# Patient Record
Sex: Male | Born: 1950
Health system: Southern US, Community
[De-identification: ages and names within clinical notes are randomized; demographics above are authoritative.]

## PROBLEM LIST (undated history)

## (undated) DIAGNOSIS — H9190 Unspecified hearing loss, unspecified ear: Secondary | ICD-10-CM

---

## 2009-07-02 ENCOUNTER — Emergency Department (HOSPITAL_COMMUNITY): Admission: EM | Admit: 2009-07-02 | Discharge: 2009-07-02 | Payer: Self-pay | Admitting: Family Medicine

## 2012-01-09 ENCOUNTER — Emergency Department (HOSPITAL_COMMUNITY)
Admission: EM | Admit: 2012-01-09 | Discharge: 2012-01-09 | Disposition: A | Discharge status: Home / Self Care | Payer: BC Managed Care – PPO | Source: Home / Self Care | Attending: Emergency Medicine | Admitting: Emergency Medicine

## 2012-01-09 ENCOUNTER — Emergency Department (INDEPENDENT_AMBULATORY_CARE_PROVIDER_SITE_OTHER): Payer: BC Managed Care – PPO

## 2012-01-09 ENCOUNTER — Encounter (HOSPITAL_COMMUNITY): Payer: Self-pay | Admitting: *Deleted

## 2012-01-09 DIAGNOSIS — M5412 Radiculopathy, cervical region: Secondary | ICD-10-CM

## 2012-01-09 HISTORY — DX: Unspecified hearing loss, unspecified ear: H91.90

## 2012-01-09 MED ORDER — MELOXICAM 7.5 MG PO TABS: 7.5000 mg | ORAL_TABLET | Freq: Every day | ORAL | Status: AC

## 2012-01-09 NOTE — Discharge Instructions (Signed)
Today we discussed your x-ray results and diagnostic impression based on your symptoms and physical exam. Have recommended you followup with your new primary care Dr. for this reason and also for your health care maintenance and screening recommendations for your age group. (See referral swallow).  While you establish care with a primary care doctor's office have described to you what symptoms will require further emergent attention if symptoms were to change or exacerbate. Take this prescribe medicine for 2 weeks as discussed.    Cervical Radiculopathy Cervical radiculopathy happens when a nerve in the neck is pinched or bruised by a slipped (herniated) disk or by arthritic changes in the bones of the cervical spine. This can occur due to an injury or as part of the normal aging process. Pressure on the cervical nerves can cause pain or numbness that runs from your neck all the way down into your arm and fingers. CAUSES  There are many possible causes, including:  Injury.   Muscle tightness in the neck from overuse.   Swollen, painful joints (arthritis).   Breakdown or degeneration in the bones and joints of the spine (spondylosis) due to aging.   Bone spurs that may develop near the cervical nerves.  SYMPTOMS  Symptoms include pain, weakness, or numbness in the affected arm and hand. Pain can be severe or irritating. Symptoms may be worse when extending or turning the neck. DIAGNOSIS  Your caregiver will ask about your symptoms and do a physical exam. He or she may test your strength and reflexes. X-rays, CT scans, and MRI scans may be needed in cases of injury or if the symptoms do not go away after a period of time. Electromyography (EMG) or nerve conduction testing may be done to study how your nerves and muscles are working. TREATMENT  Your caregiver may recommend certain exercises to help relieve your symptoms. Cervical radiculopathy can, and often does, get better with time and  treatment. If your problems continue, treatment options may include:  Wearing a soft collar for short periods of time.   Physical therapy to strengthen the neck muscles.   Medicines, such as nonsteroidal anti-inflammatory drugs (NSAIDs), oral corticosteroids, or spinal injections.   Surgery. Different types of surgery may be done depending on the cause of your problems.  HOME CARE INSTRUCTIONS   Put ice on the affected area.   Put ice in a plastic bag.   Place a towel between your skin and the bag.   Leave the ice on for 15 to 20 minutes, 3 to 4 times a day or as directed by your caregiver.   Use a flat pillow when you sleep.   Only take over-the-counter or prescription medicines for pain, discomfort, or fever as directed by your caregiver.   If physical therapy was prescribed, follow your caregiver's directions.   If a soft collar was prescribed, use it as directed.  SEEK IMMEDIATE MEDICAL CARE IF:   Your pain gets much worse and cannot be controlled with medicines.   You have weakness or numbness in your hand, arm, face, or leg.   You have a high fever or a stiff, rigid neck.   You lose bowel or bladder control (incontinence).   You have trouble with walking, balance, or speaking.  MAKE SURE YOU:   Understand these instructions.   Will watch your condition.   Will get help right away if you are not doing well or get worse.  Document Released: 06/29/2001 Document Revised: 09/23/2011 Document Reviewed:  05/18/2011 ExitCare Patient Information 2012 [place], [place].

## 2012-01-09 NOTE — ED Provider Notes (Signed)
History     CSN: [id number]  Arrival date & time 01/09/12  0905   First MD Initiated Contact with Patient 01/09/12 [phone]      Chief Complaint  Patient presents with  . Shoulder Pain    (Consider location/radiation/quality/duration/timing/severity/associated sxs/prior treatment) HPI Comments: P patient describes to translator (accompanying patient) has had this gradual onset of left shoulder pain that shoots all way up to his neck and down to his upper arm forearm down to his hands feeling occasional numbness and tingling on his left index finger. Patient has tried over-the-counter Advil and different pads. Raising his arm up seems to exacerbate the pain. Patient denies any recent injury trauma or significant change in physical activity.  Significant history patient denies any chest pain, shortness of breath, previous shoulder pains, no respiratory symptoms including cough.   Patient is a 61 y.o. male presenting with shoulder pain. The history is provided by the patient and a relative. The history is limited by a language barrier. No language interpreter was used (translasting adult- 100 % bilungual).  Shoulder Pain This is a new problem. The problem occurs constantly. The problem has been gradually worsening. Pertinent negatives include no chest pain, no abdominal pain, no headaches and no shortness of breath. Exacerbated by: shoulder and neck movements. He has tried nothing for the symptoms. The treatment provided no relief.    Past Medical History  Diagnosis Date  . Hard of hearing     History reviewed. No pertinent past surgical history.  History reviewed. No pertinent family history.  History  Substance Use Topics  . Smoking status: Current Everyday Smoker -- 0.3 packs/day  . Smokeless tobacco: Not on file  . Alcohol Use: Yes     Occasional      Review of Systems  Constitutional: Negative for chills, activity change, appetite change, fatigue and unexpected weight  change.  HENT: Positive for neck pain.   Respiratory: Negative for cough and shortness of breath.   Cardiovascular: Negative for chest pain, palpitations and leg swelling.  Gastrointestinal: Negative for abdominal pain.  Musculoskeletal: Positive for arthralgias.  Skin: Negative for rash.  Neurological: Positive for numbness. Negative for dizziness, speech difficulty, weakness and headaches.    Allergies  Review of patient's allergies indicates no known allergies.  Home Medications   Current Outpatient Rx  Name Route Sig Dispense Refill  . MELOXICAM 7.5 MG PO TABS Oral Take 1 tablet (7.5 mg total) by mouth daily. 14 tablet 0    BP 161/80  Pulse 94  Temp(Src) 97.9 F (36.6 C) (Oral)  Resp 14  SpO2 98%  Physical Exam  Nursing note and vitals reviewed. Constitutional: He appears well-developed and well-nourished.  HENT:  Head: Normocephalic.  Eyes: Conjunctivae are normal.  Neck: Neck supple. No JVD present. Spinous process tenderness and muscular tenderness present. Carotid bruit is not present. No rigidity. No erythema and normal range of motion present. No Kernig's sign noted.  Cardiovascular: Regular rhythm and normal pulses.   No extrasystoles are present.  Pulmonary/Chest: Effort normal and breath sounds normal. He exhibits no deformity and no swelling.  Musculoskeletal:       Cervical back: He exhibits tenderness, bony tenderness and pain. He exhibits no swelling, no edema, no deformity and no spasm.       Back:  Lymphadenopathy:    He has no cervical adenopathy.  Skin: No rash noted. No erythema.    ED Course  Procedures (including critical care time)  Labs Reviewed - No  data to display Dg Cervical Spine Complete  01/09/2012  *RADIOLOGY REPORT*  Clinical Data: Gradual onset of left shoulder pain  CERVICAL SPINE - COMPLETE 4+ VIEW  Comparison: None.  Findings:  C1 to the superior endplate of T1 is visualized on the lateral radiograph.  Normal alignment of the  cervical spine without anterolisthesis or retrolisthesis. The dens is normally seated between the lateral masses of C1.  Vertebral body heights are preserved.  Limbus bodies are incidentally noted about the anterior inferior endplates of the C3, C5 and C6 vertebral bodies.  Query mild DDD at C5 - C6 with mild disc space height loss and endplate irregularity.  The bilateral neural foramina are grossly patent.  Limited visualization of the lung apices is normal.  Likely vascular calcifications within the bilateral carotid bulbs, right greater than left.  IMPRESSION: Mild DDD of C5 - C6.  Original Report Authenticated By: Waynard Reeds, M.D.     1. Cervical radiculopathy       MDM  New onset of left posterior neck and shoulder pain with peripheral paresthesias. No muscular deficits noted cervical C-spine showed mild degenerative changes C5-C6. Patient will followup with primary care Dr. and instructed about what further symptoms to be watchful for and that will require followup if this paresthesias and left and neck pain persist. Noted also to be hypertensive told to followup for blood pressure recheck        Jimmie Molly, MD 01/09/12 1147

## 2012-01-09 NOTE — ED Notes (Signed)
C/O gradual onset left shoulder pain since 3/10.  Denies injury.  C/O intermittent numbness radiating down to fingers - especially 2nd digit.  Has  Tried Icy-Hot, Advil, and salon pads? without relief.  C/O painful ROM.  Denies chest pain.

## 2012-07-05 ENCOUNTER — Emergency Department (HOSPITAL_COMMUNITY)
Admission: EM | Admit: 2012-07-05 | Discharge: 2012-07-05 | Disposition: A | Discharge status: Home / Self Care | Payer: BC Managed Care – PPO | Source: Home / Self Care

## 2012-07-05 ENCOUNTER — Encounter (HOSPITAL_COMMUNITY): Payer: Self-pay

## 2012-07-05 DIAGNOSIS — M549 Dorsalgia, unspecified: Secondary | ICD-10-CM

## 2012-07-05 DIAGNOSIS — R109 Unspecified abdominal pain: Secondary | ICD-10-CM

## 2012-07-05 LAB — POCT URINALYSIS DIP (DEVICE)
Protein, ur: >=300 mg/dL — AB
Specific Gravity, Urine: >=1.03 (ref 1.005–1.030)
Urobilinogen, UA: 1 mg/dL (ref 0.0–1.0)
pH: 5.5 (ref 5.0–8.0)

## 2012-07-05 MED ORDER — CYCLOBENZAPRINE HCL 5 MG PO TABS: 5.0000 mg | ORAL_TABLET | Freq: Three times a day (TID) | ORAL | Status: DC | PRN

## 2012-07-05 NOTE — ED Notes (Signed)
C/o abdominal pain, states that it radiates right side to lower back, also states very little urine when he goes to the restroom

## 2012-07-05 NOTE — ED Provider Notes (Signed)
History     CSN: [id number]  Arrival date & time 07/05/12  1633   None     Chief Complaint  Patient presents with  . 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 Pain across lower back and bilateral hips. Worse with bending forward, twisting or other movements of the torso.  No known trauma or injurious event. Denies numbness, tingling or focal weakness.  He is wearing several Solonpas patches,  Patient is a 61 y.o. male presenting with abdominal pain.  Abdominal Pain The primary symptoms of the illness include abdominal pain.    Past Medical History  Diagnosis Date  . Hard of hearing     History reviewed. No pertinent past surgical history.  History reviewed. No pertinent family history.  History  Substance Use Topics  . Smoking status: Current Every Day Smoker -- 0.3 packs/day  . Smokeless tobacco: Not on file  . Alcohol Use: Yes     Occasional      Review of Systems  Constitutional: Negative.   Respiratory: Negative.   Gastrointestinal: Positive for abdominal pain.  Genitourinary: Negative.   Musculoskeletal:       As per HPI  Skin: Negative.   Neurological: Negative for dizziness, weakness, numbness and headaches.    Allergies  Review of patient's allergies indicates no known allergies.  Home Medications   Current Outpatient Rx  Name Route Sig Dispense Refill  . CYCLOBENZAPRINE HCL 5 MG PO TABS Oral Take 1 tablet (5 mg total) by mouth 3 (three) times daily as needed for muscle spasms. 30 tablet 0  . MELOXICAM 7.5 MG PO TABS Oral Take 1 tablet (7.5 mg total) by mouth daily. 14 tablet 0    BP 151/64  Pulse 93  Temp 98.2 F (36.8 C) (Oral)  Resp 18  SpO2 100%  Physical Exam  Constitutional: He is oriented to person, place, and time. He appears well-developed and well-nourished.   HENT:  Head: Normocephalic and atraumatic.  Eyes: EOM are normal. Left eye exhibits no discharge.  Neck: Normal range of motion. Neck supple.  Abdominal: Soft. He exhibits no distension. There is no tenderness. There is no rebound.  Musculoskeletal: He exhibits tenderness. He exhibits no edema.       Tenderness along the lower para lumbosacral musculature, superior borders of the iliac creasts bilat.  Unable to demonstrate flex/extenor bilateral movements (side to side) without pain at approx 20 degrees.  Neurological: He is alert and oriented to person, place, and time. No cranial nerve deficit.  Skin: Skin is warm and dry.  Psychiatric: He has a normal mood and affect.    ED Course  Procedures (including critical care time)  Labs Reviewed  POCT URINALYSIS DIP (DEVICE) - Abnormal; Notable for the following:    Bilirubin Urine SMALL (*)     Ketones, ur 15 (*)     Protein, ur >=300 (*)     All other components within normal limits   No results found.   1. Back pain   2. Abdominal wall pain       MDM  Back pain instructions Heat to back and hips Motrin 400 q 6-8h prn and Flexeril tid prn No work for 3 d         Hayden Rasmussen, NP 07/05/12 1844

## 2012-07-05 NOTE — ED Provider Notes (Signed)
Medical screening examination/treatment/procedure(s) were performed by non-physician practitioner and as supervising physician I was immediately available for consultation/collaboration.  Raynald Blend, MD 07/05/12 2053

## 2013-04-04 ENCOUNTER — Emergency Department (INDEPENDENT_AMBULATORY_CARE_PROVIDER_SITE_OTHER): Payer: BC Managed Care – PPO

## 2013-04-04 ENCOUNTER — Encounter (HOSPITAL_COMMUNITY): Payer: Self-pay

## 2013-04-04 ENCOUNTER — Emergency Department (INDEPENDENT_AMBULATORY_CARE_PROVIDER_SITE_OTHER)
Admission: EM | Admit: 2013-04-04 | Discharge: 2013-04-04 | Disposition: A | Discharge status: Home / Self Care | Payer: BC Managed Care – PPO | Source: Home / Self Care | Attending: Family Medicine | Admitting: Family Medicine

## 2013-04-04 DIAGNOSIS — S60222A Contusion of left hand, initial encounter: Secondary | ICD-10-CM

## 2013-04-04 DIAGNOSIS — S60229A Contusion of unspecified hand, initial encounter: Secondary | ICD-10-CM

## 2013-04-04 NOTE — ED Notes (Signed)
Reportedly hand injury to left hand earlier today at work; here w daughter who is acting as Nurse, learning disability ; NAD at present. C/o pain thumb, thenar region, wrist

## 2013-04-04 NOTE — ED Provider Notes (Signed)
History     CSN: [id number]  Arrival date & time 04/04/13  1807   First MD Initiated Contact with Patient 04/04/13 1826      Chief Complaint  Patient presents with  . Hand Injury    (Consider location/radiation/quality/duration/timing/severity/associated sxs/prior treatment) Patient is a 62 y.o. male presenting with hand injury. The history is provided by the patient. The history is limited by a language barrier. Language interpreter used: family member translated.  Hand Injury Location:  Hand Time since incident:  8 hours Injury: yes   Mechanism of injury comment:  Metal dropped on left thumb. Hand location:  L hand Pain details:    Severity:  Mild   Progression:  Unchanged Chronicity:  New Dislocation: no   Foreign body present:  No foreign bodies   Past Medical History  Diagnosis Date  . Hard of hearing     History reviewed. No pertinent past surgical history.  History reviewed. No pertinent family history.  History  Substance Use Topics  . Smoking status: Current Every Day Smoker -- 0.33 packs/day  . Smokeless tobacco: Not on file  . Alcohol Use: Yes     Comment: Occasional      Review of Systems  Constitutional: Negative.   Musculoskeletal: Positive for joint swelling.  Skin: Negative for wound.    Allergies  Review of patient's allergies indicates no known allergies.  Home Medications   Current Outpatient Rx  Name  Route  Sig  Dispense  Refill  . cyclobenzaprine (FLEXERIL) 5 MG tablet   Oral   Take 1 tablet (5 mg total) by mouth 3 (three) times daily as needed for muscle spasms.   30 tablet   0     BP 162/68  Pulse 83  Temp(Src) 97.6 F (36.4 C) (Oral)  Resp 16  SpO2 100%  Physical Exam  Nursing note and vitals reviewed. Constitutional: He is oriented to person, place, and time. He appears well-developed and well-nourished.  Musculoskeletal: He exhibits tenderness.       Hands: Neurological: He is alert and oriented to person,  place, and time.  Skin: Skin is warm and dry.    ED Course  Procedures (including critical care time)  Labs Reviewed - No data to display Dg Hand Complete Left  04/04/2013   *RADIOLOGY REPORT*  Clinical Data: Hand injury.  LEFT HAND - COMPLETE 3+ VIEW  Comparison: None  Findings: No acute bony abnormality.  Specifically, no fracture, subluxation, or dislocation.  Soft tissues are intact.  IMPRESSION: No acute bony abnormality.   Original Report Authenticated By: Charlett Nose, M.D.     1. Contusion, hand, left, initial encounter       MDM  X-rays reviewed and report per radiologist.         Linna Hoff, MD 04/04/13 [phone]

## 2013-04-04 NOTE — ED Notes (Signed)
UDS performed by T Berton Lan

## 2013-08-28 ENCOUNTER — Emergency Department (HOSPITAL_COMMUNITY)
Admission: EM | Admit: 2013-08-28 | Discharge: 2013-08-28 | Disposition: A | Discharge status: Home / Self Care | Payer: BC Managed Care – PPO | Source: Home / Self Care

## 2013-08-28 ENCOUNTER — Encounter (HOSPITAL_COMMUNITY): Payer: Self-pay | Admitting: Emergency Medicine

## 2013-08-28 DIAGNOSIS — S139XXA Sprain of joints and ligaments of unspecified parts of neck, initial encounter: Secondary | ICD-10-CM

## 2013-08-28 DIAGNOSIS — H8110 Benign paroxysmal vertigo, unspecified ear: Secondary | ICD-10-CM

## 2013-08-28 DIAGNOSIS — S161XXA Strain of muscle, fascia and tendon at neck level, initial encounter: Secondary | ICD-10-CM

## 2013-08-28 MED ORDER — MECLIZINE HCL 25 MG PO TABS: 25.0000 mg | ORAL_TABLET | Freq: Four times a day (QID) | ORAL | Status: DC

## 2013-08-28 MED ORDER — DICLOFENAC POTASSIUM 50 MG PO TABS: 50.0000 mg | ORAL_TABLET | Freq: Three times a day (TID) | ORAL | Status: DC

## 2013-08-28 NOTE — ED Provider Notes (Signed)
CSN: [id number]     Arrival date & time 08/28/13  1640 History   First MD Initiated Contact with Patient 08/28/13 1825     Chief Complaint  Patient presents with  . Neck Pain  . Dizziness   (Consider location/radiation/quality/duration/timing/severity/associated sxs/prior Treatment) HPI Comments: 62 year old [place] but is not speaking relation is accompanied by his niece who does. She is a Nurse, learning disability. 48 hours ago the patient awoke with a sore stiff neck. There is pain in the back left muscles that radiates up to the occiput and vertex. He is also complaining of dizziness and occasional burning or vision which is worse upon movement, turning of the head placing himself in a supine position and rising from that position.   Past Medical History  Diagnosis Date  . Hard of hearing    History reviewed. No pertinent past surgical history. No family history on file. History  Substance Use Topics  . Smoking status: Current Every Day Smoker -- 0.33 packs/day  . Smokeless tobacco: Not on file  . Alcohol Use: Yes     Comment: Occasional    Review of Systems  Constitutional: Negative for fever, diaphoresis, activity change, appetite change and fatigue.  Eyes: Negative for photophobia, pain, discharge, redness and itching.  Respiratory: Negative.   Cardiovascular: Negative.   Gastrointestinal: Negative.   Genitourinary: Negative.   Musculoskeletal: Positive for neck pain.  Skin: Negative.   Neurological: Positive for dizziness and headaches. Negative for tremors, syncope, speech difficulty and numbness.    Allergies  Review of patient's allergies indicates no known allergies.  Home Medications   Current Outpatient Rx  Name  Route  Sig  Dispense  Refill  . diclofenac (CATAFLAM) 50 MG tablet   Oral   Take 1 tablet (50 mg total) by mouth 3 (three) times daily. Prn neck pain.  Take with food.   21 tablet   0   . meclizine (ANTIVERT) 25 MG tablet   Oral   Take 1 tablet (25 mg  total) by mouth 4 (four) times daily. As needed for dizziness. May cause drowsiness.   28 tablet   0    BP 151/94  Pulse 79  Temp(Src) 98.4 F (36.9 C) (Oral)  Resp 16  SpO2 98% Physical Exam  Nursing note and vitals reviewed. Constitutional: He is oriented to person, place, and time. He appears well-developed and well-nourished. No distress.  HENT:  Mouth/Throat: Oropharynx is clear and moist. No oropharyngeal exudate.  Left TM with old ruptured membrane. No erythema or drainage. Right TM somewhat distorted and faint erythema at the 2:00 position.  Eyes: Conjunctivae and EOM are normal. Pupils are equal, round, and reactive to light.  Neck: Normal range of motion. Neck supple.  Tenderness over the left splenius capitis muscle tracts over the occiput. There is muscle pain is made worse with rotation of the head which is beyond 45 to the left and right. Flexion of the neck to 45. No spinal tenderness. No deformity or swelling. No carotid bruits.  Cardiovascular: Normal rate, regular rhythm and normal heart sounds.   Pulmonary/Chest: Effort normal and breath sounds normal. No respiratory distress. He has no wheezes.  Musculoskeletal: Normal range of motion. He exhibits no edema.  Lymphadenopathy:    He has no cervical adenopathy.  Neurological: He is alert and oriented to person, place, and time. No cranial nerve deficit or sensory deficit. He exhibits normal muscle tone. Gait normal.  Skin: Skin is warm and dry. No rash noted.  Psychiatric:  He has a normal mood and affect.    ED Course  Procedures (including critical care time) Labs Review Labs Reviewed - No data to display Imaging Review No results found.      MDM   1. BPV (benign positional vertigo)   2. Strain of neck muscle, initial encounter      Follow with a doctor., call the above number to establish with a PCP. Very important. If worse, new symptoms or problems seek medical care promptly.  antivert 25 q 8h  prn. Plenty of fluids.  Cataflam for pain  Hayden Rasmussen, NP 08/28/13 [phone]

## 2013-08-28 NOTE — ED Notes (Signed)
Neck soreness, dizziness, onset Sunday.

## 2013-09-19 NOTE — ED Provider Notes (Signed)
Medical screening examination/treatment/procedure(s) were performed by resident physician or non-physician practitioner and as supervising physician I was immediately available for consultation/collaboration.   Tommy Bentley DOUGLAS MD.   Tommy Bentley D Tommy Borman, MD 09/19/13 1900 

## 2015-09-17 ENCOUNTER — Emergency Department (INDEPENDENT_AMBULATORY_CARE_PROVIDER_SITE_OTHER): Payer: BLUE CROSS/BLUE SHIELD

## 2015-09-17 ENCOUNTER — Emergency Department (HOSPITAL_COMMUNITY)
Admission: EM | Admit: 2015-09-17 | Discharge: 2015-09-17 | Disposition: A | Discharge status: Home / Self Care | Payer: BLUE CROSS/BLUE SHIELD | Source: Home / Self Care | Attending: Emergency Medicine | Admitting: Emergency Medicine

## 2015-09-17 ENCOUNTER — Encounter (HOSPITAL_COMMUNITY): Payer: Self-pay | Admitting: Emergency Medicine

## 2015-09-17 DIAGNOSIS — M4692 Unspecified inflammatory spondylopathy, cervical region: Secondary | ICD-10-CM

## 2015-09-17 DIAGNOSIS — M542 Cervicalgia: Secondary | ICD-10-CM

## 2015-09-17 DIAGNOSIS — M47812 Spondylosis without myelopathy or radiculopathy, cervical region: Secondary | ICD-10-CM

## 2015-09-17 MED ORDER — DICLOFENAC SODIUM 50 MG PO TBEC: 50.0000 mg | DELAYED_RELEASE_TABLET | Freq: Three times a day (TID) | ORAL | Status: AC | PRN

## 2015-09-17 MED ORDER — PREDNISONE 50 MG PO TABS: ORAL_TABLET | ORAL | Status: AC

## 2015-09-17 MED ORDER — GABAPENTIN 300 MG PO CAPS: 300.0000 mg | ORAL_CAPSULE | Freq: Every day | ORAL | Status: AC

## 2015-09-17 NOTE — ED Notes (Signed)
The patient presented to the [facility] with a complaint of neck pain that is accompanied by dizziness that started yesterday. The patient denied any know injury.

## 2015-09-17 NOTE — ED Provider Notes (Signed)
CSN: [id number]     Arrival date & time 09/17/15  1701 History   First MD Initiated Contact with Patient 09/17/15 1743     Chief Complaint  Patient presents with  . Neck Pain  . Dizziness   (Consider location/radiation/quality/duration/timing/severity/associated sxs/prior Treatment) HPI He is a 64 year old man here for evaluation of right neck and shoulder pain. His granddaughter is present who acts as interpreter. He states that yesterday he developed pain in the right neck and shoulder. It radiates to the back of his head and forehead. He states he is also having episodes of dizziness that he describes as a spinning sensation. He denies any weakness, numbness, tingling in his upper extremities. The pain is worse with certain movements.  No fevers. No known injury or trauma. He works in the [employer], but states he does not have to lift heavy things.  Past Medical History  Diagnosis Date  . Hard of hearing    History reviewed. No pertinent past surgical history. History reviewed. No pertinent family history. Social History  Substance Use Topics  . Smoking status: Current Every Day Smoker -- 0.33 packs/day  . Smokeless tobacco: None  . Alcohol Use: Yes     Comment: Occasional    Review of Systems As in history of present illness Allergies  Review of patient's allergies indicates no known allergies.  Home Medications   Prior to Admission medications   Medication Sig Start Date End Date Taking? Authorizing Provider  diclofenac (VOLTAREN) 50 MG EC tablet Take 1 tablet (50 mg total) by mouth 3 (three) times daily as needed for moderate pain. 09/17/15   Melony Overly, MD  gabapentin (NEURONTIN) 300 MG capsule Take 1 capsule (300 mg total) by mouth at bedtime. 09/17/15   Melony Overly, MD  predniSONE (DELTASONE) 50 MG tablet Take 1 pill daily for 5 days. 09/17/15   Melony Overly, MD   Meds Ordered and Administered this Visit  Medications - No data to display  BP 150/75 mmHg   Pulse 76  Temp(Src) 98.1 F (36.7 C) (Oral)  Resp 16  SpO2 100% No data found.   Physical Exam  Constitutional: He is oriented to person, place, and time. He appears well-developed and well-nourished. No distress.  HENT:  Right TM is distorted, but no erythema. 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  Neck:  He has good range of motion, but pain with flexion and rotation. He does have some tenderness along the midline neck. He has muscle spasm in the right trapezius. He is tender along the trapezius.  Cardiovascular: Normal rate, regular rhythm and normal heart sounds.   Pulmonary/Chest: Effort normal and breath sounds normal. No respiratory distress. He has no wheezes. He has no rales.  Musculoskeletal:  Right shoulder: No swelling. He is tender along the superior shoulder. 5 out of 5 strength in bilateral upper extremities. Positive Hawkins.  Neurological: He is alert and oriented to person, place, and time.    ED Course  Procedures (including critical care time)  Labs Review Labs Reviewed - No data to display  Imaging Review Dg Cervical Spine Complete  09/17/2015  CLINICAL DATA:  Neck pain started yesterday, no injury. EXAM: CERVICAL SPINE - COMPLETE 4+ VIEW COMPARISON:  January 09, 2012 FINDINGS: There is no evidence of cervical spine fracture or prevertebral soft tissue swelling. Alignment is normal. There is narrowed joint space and osteophyte formation at C4, C5 and C6. There is narrowed right C3-4, C4-5, C5-6 neural foramina and  narrowed left C5 6 neural foramina due to osteophyte encroachment. IMPRESSION: No acute fracture or dislocation. Degenerative joint changes of cervical spine. Electronically Signed   By: Abelardo Diesel M.D.   On: 09/17/2015 18:42      MDM   1. Neck pain   2. Arthritis of neck ([place])    X-ray raises some concern for nerve impingement. We'll treat with prednisone and gabapentin. Diclofenac for pain. Follow-up if not improving in 1-2 weeks.    Melony Overly, MD 09/17/15 [phone]

## 2015-09-17 NOTE — Discharge Instructions (Signed)
You have arthritis in your neck that is contributing to your pain. Take prednisone daily for 5 days. Take diclofenac 3 times a day as needed for pain. Apply a heating pad to the right neck and shoulder 2-3 times a day. Take gabapentin at night for 2 weeks. Follow-up as needed.

## 2016-04-21 ENCOUNTER — Ambulatory Visit (HOSPITAL_COMMUNITY)
Admission: EM | Admit: 2016-04-21 | Discharge: 2016-04-21 | Disposition: A | Discharge status: Home / Self Care | Payer: Medicare Other | Attending: Emergency Medicine | Admitting: Emergency Medicine

## 2016-04-21 ENCOUNTER — Encounter (HOSPITAL_COMMUNITY): Payer: Self-pay | Admitting: Emergency Medicine

## 2016-04-21 ENCOUNTER — Ambulatory Visit (INDEPENDENT_AMBULATORY_CARE_PROVIDER_SITE_OTHER): Payer: Medicare Other

## 2016-04-21 DIAGNOSIS — M25511 Pain in right shoulder: Secondary | ICD-10-CM | POA: Diagnosis not present

## 2016-04-21 MED ORDER — TRIAMCINOLONE ACETONIDE 40 MG/ML IJ SUSP
40.0000 mg | Freq: Once | INTRAMUSCULAR | Status: AC
  Administered 2016-04-21: 40 mg via INTRA_ARTICULAR

## 2016-04-21 MED ORDER — BUPIVACAINE HCL (PF) 0.25 % IJ SOLN
10.0000 mL | Freq: Once | INTRAMUSCULAR | Status: AC
  Administered 2016-04-21: 10 mL

## 2016-04-21 MED ORDER — DICLOFENAC SODIUM & CAPSAICIN 1.5 & 0.025 % CO THPK: 1.0000 "application " | PACK | Freq: Two times a day (BID) | Status: AC

## 2016-04-21 MED ORDER — TRIAMCINOLONE ACETONIDE 40 MG/ML IJ SUSP
INTRAMUSCULAR | Status: AC
  Filled 2016-04-21: qty 1

## 2016-04-21 MED ORDER — LIDOCAINE HCL 2 % IJ SOLN
INTRAMUSCULAR | Status: AC
  Filled 2016-04-21: qty 20

## 2016-04-21 MED ORDER — BUPIVACAINE HCL (PF) 0.5 % IJ SOLN
INTRAMUSCULAR | Status: AC
  Filled 2016-04-21: qty 10

## 2016-04-21 MED ORDER — LIDOCAINE HCL (PF) 1 % IJ SOLN
5.0000 mL | Freq: Once | INTRAMUSCULAR | Status: AC
  Administered 2016-04-21: 5 mL

## 2016-04-21 NOTE — Discharge Instructions (Signed)
Heat Therapy Heat therapy can help ease sore, stiff, injured, and tight muscles and joints. Heat relaxes your muscles, which may help ease your pain. Heat therapy should only be used on old, pre-existing, or long-lasting (chronic) injuries. Do not use heat therapy unless told by your doctor. HOW TO USE HEAT THERAPY There are several different kinds of heat therapy, including:  Moist heat pack.  Warm water bath.  Hot water bottle.  Electric heating pad.  Heated gel pack.  Heated wrap.  Electric heating pad. GENERAL HEAT THERAPY RECOMMENDATIONS   Do not sleep while using heat therapy. Only use heat therapy while you are awake.  Your skin may turn pink while using heat therapy. Do not use heat therapy if your skin turns red.  Do not use heat therapy if you have new pain.  High heat or long exposure to heat can cause burns. Be careful when using heat therapy to avoid burning your skin.  Do not use heat therapy on areas of your skin that are already irritated, such as with a rash or sunburn. GET HELP IF:   You have blisters, redness, swelling (puffiness), or numbness.  You have new pain.  Your pain is worse. MAKE SURE YOU:  Understand these instructions.  Will watch your condition.  Will get help right away if you are not doing well or get worse.   This information is not intended to replace advice given to you by your health care provider. Make sure you discuss any questions you have with your health care provider.   Document Released: 12/27/2011 Document Revised: 10/25/2014 Document Reviewed: 11/27/2013 Elsevier Interactive Patient Education 2016 [employer] Pain Joint pain, which is also called arthralgia, can be caused by many things. Joint pain often goes away when you follow your health care provider's instructions for relieving pain at home. However, joint pain can also be caused by conditions that require further treatment. Common causes of joint pain  include:  Bruising in the area of the joint.  Overuse of the joint.  Wear and tear on the joints that occur with aging (osteoarthritis).  Various other forms of arthritis.  A buildup of a crystal form of uric acid in the joint (gout).  Infections of the joint (septic arthritis) or of the bone (osteomyelitis). Your health care provider may recommend medicine to help with the pain. If your joint pain continues, additional tests may be needed to diagnose your condition. HOME CARE INSTRUCTIONS Watch your condition for any changes. Follow these instructions as directed to lessen the pain that you are feeling.  Take medicines only as directed by your health care provider.  Rest the affected area for as long as your health care provider says that you should. If directed to do so, raise the painful joint above the level of your heart while you are sitting or lying down.  Do not do things that cause or worsen pain.  If directed, apply ice to the painful area:  Put ice in a plastic bag.  Place a towel between your skin and the bag.  Leave the ice on for 20 minutes, 2-3 times per day.  Wear an elastic bandage, splint, or sling as directed by your health care provider. Loosen the elastic bandage or splint if your fingers or toes become numb and tingle, or if they turn cold and blue.  Begin exercising or stretching the affected area as directed by your health care provider. Ask your health care provider what types  of exercise are safe for you.  Keep all follow-up visits as directed by your health care provider. This is important. SEEK MEDICAL CARE IF:  Your pain increases, and medicine does not help.  Your joint pain does not improve within 3 days.  You have increased bruising or swelling.  You have a fever.  You lose 10 lb (4.5 kg) or more without trying. SEEK IMMEDIATE MEDICAL CARE IF:  You are not able to move the joint.  Your fingers or toes become numb or they turn cold and  blue.   This information is not intended to replace advice given to you by your health care provider. Make sure you discuss any questions you have with your health care provider.   Document Released: 10/04/2005 Document Revised: 10/25/2014 Document Reviewed: 07/16/2014 Elsevier Interactive Patient Education [employer].

## 2016-04-21 NOTE — ED Provider Notes (Signed)
CSN: [id number]     Arrival date & time 04/21/16  1322 History   First MD Initiated Contact with Patient 04/21/16 1426     Chief Complaint  Patient presents with  . 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 Hard of hearing    History reviewed. No pertinent past surgical history. History reviewed. No pertinent family history. Social History  Substance Use Topics  . Smoking status: Current Every Day Smoker -- 0.33 packs/day  . Smokeless tobacco: None  . Alcohol Use: Yes     Comment: Occasional    Review of Systems  Denies: HEADACHE, NAUSEA, ABDOMINAL PAIN, CHEST PAIN, CONGESTION, DYSURIA, SHORTNESS OF BREATH  Allergies  Review of patient's allergies indicates no known allergies.  Home Medications   Prior to Admission medications   Medication Sig Start Date End Date Taking? Authorizing Provider  diclofenac (VOLTAREN) 50 MG EC tablet Take 1 tablet (50 mg total) by mouth 3 (three) times daily as needed for moderate pain. 09/17/15   Melony Overly, MD  gabapentin (NEURONTIN) 300 MG capsule Take 1 capsule (300 mg total) by mouth at bedtime. 09/17/15   Melony Overly, MD  predniSONE (DELTASONE) 50 MG tablet Take 1 pill daily for 5 days. 09/17/15   Melony Overly, MD   Meds Ordered and Administered this Visit  Medications - No data to display  BP 170/75 mmHg  Pulse 70  Temp(Src) 98.3 F (36.8 C) (Oral)  Resp 12  SpO2 100% No data found.   Physical Exam NURSES NOTES AND VITAL SIGNS REVIEWED. CONSTITUTIONAL: Well developed, well nourished, no acute distress HEENT:  normocephalic, atraumatic EYES: Conjunctiva normal NECK:normal ROM, supple, no adenopathy PULMONARY:No respiratory distress, normal effort ABDOMINAL: Soft, ND, NT BS+, No CVAT MUSCULOSKELETAL: Normal ROM of all extremities, Right anterior shoulder tender to palpation. Decreased ROM SKIN: warm and dry without rash PSYCHIATRIC: Mood and affect, behavior are normal  ED Course  Injection of joint Date/Time: 04/21/2016 3:22 PM Performed by: Konrad Felix Authorized by: Melony Overly Consent: Verbal consent obtained. Risks and benefits: risks, benefits and alternatives were discussed Consent given by: patient Patient identity confirmed: verbally with patient Time out: Immediately prior to procedure a "time out" was called to verify the correct patient, procedure, equipment, support staff and site/side marked as required. Preparation: Patient was prepped and draped in the usual sterile fashion. Local anesthesia used: no Patient sedated: no Patient tolerance: Patient tolerated the procedure well with no immediate complications   (including critical care time)  Labs Review Labs Reviewed - No data to display  Imaging Review No results found.  Reviewed films and report used in forming MDM Visual Acuity Review  Right Eye Distance:   Left Eye Distance:   Bilateral Distance:    Right Eye Near:   Left Eye Near:    Bilateral Near:       After shoulder injection patient states he is pain free and moves shoulder without worry.  MDM   1. Shoulder pain, acute, right     Patient is reassured that there are no issues that require transfer to higher level of care at  this time or additional tests. Patient is advised to continue home symptomatic treatment. Patient is advised that if there are new or worsening symptoms to attend the emergency department, contact primary care provider, or return to UC. Instructions of care provided discharged home in stable condition.    THIS NOTE WAS  GENERATED USING A VOICE RECOGNITION SOFTWARE PROGRAM. ALL REASONABLE EFFORTS  WERE MADE TO PROOFREAD THIS DOCUMENT FOR ACCURACY.  I have verbally reviewed the discharge instructions with the patient. A printed AVS was given to the patient.  All questions were answered prior to discharge.      Konrad Felix, [place] 04/21/16 [phone]

## 2016-04-21 NOTE — ED Notes (Signed)
The patient presented to the [facility] with his family with a complaint of right shoulder pain that radiates down into his right arm. 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

## 2017-12-29 DIAGNOSIS — J111 Influenza due to unidentified influenza virus with other respiratory manifestations: Secondary | ICD-10-CM | POA: Diagnosis not present

## 2018-01-09 ENCOUNTER — Ambulatory Visit: Payer: Self-pay | Admitting: Family Medicine

## 2018-01-22 ENCOUNTER — Emergency Department (HOSPITAL_COMMUNITY): Payer: Medicare Other

## 2018-01-22 ENCOUNTER — Other Ambulatory Visit: Payer: Self-pay

## 2018-01-22 ENCOUNTER — Encounter (HOSPITAL_COMMUNITY): Payer: Self-pay | Admitting: Emergency Medicine

## 2018-01-22 ENCOUNTER — Inpatient Hospital Stay (HOSPITAL_COMMUNITY)
Admission: EM | Admit: 2018-01-22 | Discharge: 2018-02-15 | DRG: 064 | Disposition: E | Payer: Medicare Other | Attending: Internal Medicine | Admitting: Internal Medicine

## 2018-01-22 DIAGNOSIS — Z515 Encounter for palliative care: Secondary | ICD-10-CM | POA: Diagnosis not present

## 2018-01-22 DIAGNOSIS — E43 Unspecified severe protein-calorie malnutrition: Secondary | ICD-10-CM

## 2018-01-22 DIAGNOSIS — I6521 Occlusion and stenosis of right carotid artery: Secondary | ICD-10-CM | POA: Diagnosis present

## 2018-01-22 DIAGNOSIS — I739 Peripheral vascular disease, unspecified: Secondary | ICD-10-CM

## 2018-01-22 DIAGNOSIS — A419 Sepsis, unspecified organism: Secondary | ICD-10-CM | POA: Diagnosis not present

## 2018-01-22 DIAGNOSIS — Z4659 Encounter for fitting and adjustment of other gastrointestinal appliance and device: Secondary | ICD-10-CM

## 2018-01-22 DIAGNOSIS — Z7189 Other specified counseling: Secondary | ICD-10-CM

## 2018-01-22 DIAGNOSIS — R0602 Shortness of breath: Secondary | ICD-10-CM

## 2018-01-22 DIAGNOSIS — N400 Enlarged prostate without lower urinary tract symptoms: Secondary | ICD-10-CM | POA: Diagnosis present

## 2018-01-22 DIAGNOSIS — R042 Hemoptysis: Secondary | ICD-10-CM | POA: Diagnosis present

## 2018-01-22 DIAGNOSIS — R270 Ataxia, unspecified: Secondary | ICD-10-CM | POA: Diagnosis present

## 2018-01-22 DIAGNOSIS — D638 Anemia in other chronic diseases classified elsewhere: Secondary | ICD-10-CM | POA: Diagnosis present

## 2018-01-22 DIAGNOSIS — E872 Acidosis: Secondary | ICD-10-CM | POA: Diagnosis present

## 2018-01-22 DIAGNOSIS — I63512 Cerebral infarction due to unspecified occlusion or stenosis of left middle cerebral artery: Principal | ICD-10-CM | POA: Diagnosis present

## 2018-01-22 DIAGNOSIS — R32 Unspecified urinary incontinence: Secondary | ICD-10-CM | POA: Diagnosis present

## 2018-01-22 DIAGNOSIS — Z66 Do not resuscitate: Secondary | ICD-10-CM | POA: Diagnosis present

## 2018-01-22 DIAGNOSIS — R4701 Aphasia: Secondary | ICD-10-CM | POA: Diagnosis present

## 2018-01-22 DIAGNOSIS — K922 Gastrointestinal hemorrhage, unspecified: Secondary | ICD-10-CM

## 2018-01-22 DIAGNOSIS — R0489 Hemorrhage from other sites in respiratory passages: Secondary | ICD-10-CM | POA: Diagnosis present

## 2018-01-22 DIAGNOSIS — I639 Cerebral infarction, unspecified: Secondary | ICD-10-CM | POA: Diagnosis not present

## 2018-01-22 DIAGNOSIS — I5030 Unspecified diastolic (congestive) heart failure: Secondary | ICD-10-CM | POA: Diagnosis present

## 2018-01-22 DIAGNOSIS — F419 Anxiety disorder, unspecified: Secondary | ICD-10-CM | POA: Diagnosis present

## 2018-01-22 DIAGNOSIS — J189 Pneumonia, unspecified organism: Secondary | ICD-10-CM | POA: Diagnosis not present

## 2018-01-22 DIAGNOSIS — I6523 Occlusion and stenosis of bilateral carotid arteries: Secondary | ICD-10-CM

## 2018-01-22 DIAGNOSIS — Z79899 Other long term (current) drug therapy: Secondary | ICD-10-CM

## 2018-01-22 DIAGNOSIS — D509 Iron deficiency anemia, unspecified: Secondary | ICD-10-CM | POA: Diagnosis present

## 2018-01-22 DIAGNOSIS — E1151 Type 2 diabetes mellitus with diabetic peripheral angiopathy without gangrene: Secondary | ICD-10-CM | POA: Diagnosis present

## 2018-01-22 DIAGNOSIS — I959 Hypotension, unspecified: Secondary | ICD-10-CM | POA: Diagnosis not present

## 2018-01-22 DIAGNOSIS — H9190 Unspecified hearing loss, unspecified ear: Secondary | ICD-10-CM | POA: Diagnosis present

## 2018-01-22 DIAGNOSIS — D72820 Lymphocytosis (symptomatic): Secondary | ICD-10-CM

## 2018-01-22 DIAGNOSIS — D899 Disorder involving the immune mechanism, unspecified: Secondary | ICD-10-CM | POA: Diagnosis present

## 2018-01-22 DIAGNOSIS — D62 Acute posthemorrhagic anemia: Secondary | ICD-10-CM | POA: Diagnosis present

## 2018-01-22 DIAGNOSIS — R29810 Facial weakness: Secondary | ICD-10-CM | POA: Diagnosis present

## 2018-01-22 DIAGNOSIS — J9601 Acute respiratory failure with hypoxia: Secondary | ICD-10-CM | POA: Diagnosis present

## 2018-01-22 DIAGNOSIS — Z8673 Personal history of transient ischemic attack (TIA), and cerebral infarction without residual deficits: Secondary | ICD-10-CM

## 2018-01-22 DIAGNOSIS — G8191 Hemiplegia, unspecified affecting right dominant side: Secondary | ICD-10-CM | POA: Diagnosis present

## 2018-01-22 DIAGNOSIS — R64 Cachexia: Secondary | ICD-10-CM | POA: Diagnosis present

## 2018-01-22 DIAGNOSIS — D61818 Other pancytopenia: Secondary | ICD-10-CM | POA: Diagnosis present

## 2018-01-22 DIAGNOSIS — R6521 Severe sepsis with septic shock: Secondary | ICD-10-CM | POA: Diagnosis not present

## 2018-01-22 DIAGNOSIS — D6489 Other specified anemias: Secondary | ICD-10-CM

## 2018-01-22 DIAGNOSIS — E11649 Type 2 diabetes mellitus with hypoglycemia without coma: Secondary | ICD-10-CM | POA: Diagnosis present

## 2018-01-22 DIAGNOSIS — L8990 Pressure ulcer of unspecified site, unspecified stage: Secondary | ICD-10-CM

## 2018-01-22 DIAGNOSIS — F17200 Nicotine dependence, unspecified, uncomplicated: Secondary | ICD-10-CM

## 2018-01-22 DIAGNOSIS — R4182 Altered mental status, unspecified: Secondary | ICD-10-CM | POA: Diagnosis not present

## 2018-01-22 DIAGNOSIS — I110 Hypertensive heart disease with heart failure: Secondary | ICD-10-CM | POA: Diagnosis present

## 2018-01-22 DIAGNOSIS — N179 Acute kidney failure, unspecified: Secondary | ICD-10-CM

## 2018-01-22 DIAGNOSIS — R000 Tachycardia, unspecified: Secondary | ICD-10-CM | POA: Diagnosis present

## 2018-01-22 DIAGNOSIS — K921 Melena: Secondary | ICD-10-CM | POA: Diagnosis present

## 2018-01-22 DIAGNOSIS — Z978 Presence of other specified devices: Secondary | ICD-10-CM

## 2018-01-22 DIAGNOSIS — R159 Full incontinence of feces: Secondary | ICD-10-CM | POA: Diagnosis present

## 2018-01-22 DIAGNOSIS — I63531 Cerebral infarction due to unspecified occlusion or stenosis of right posterior cerebral artery: Secondary | ICD-10-CM | POA: Diagnosis present

## 2018-01-22 DIAGNOSIS — Z681 Body mass index (BMI) 19 or less, adult: Secondary | ICD-10-CM

## 2018-01-22 DIAGNOSIS — Z452 Encounter for adjustment and management of vascular access device: Secondary | ICD-10-CM

## 2018-01-22 DIAGNOSIS — J9690 Respiratory failure, unspecified, unspecified whether with hypoxia or hypercapnia: Secondary | ICD-10-CM

## 2018-01-22 DIAGNOSIS — D649 Anemia, unspecified: Secondary | ICD-10-CM

## 2018-01-22 DIAGNOSIS — D696 Thrombocytopenia, unspecified: Secondary | ICD-10-CM

## 2018-01-22 DIAGNOSIS — R451 Restlessness and agitation: Secondary | ICD-10-CM | POA: Diagnosis not present

## 2018-01-22 DIAGNOSIS — E871 Hypo-osmolality and hyponatremia: Secondary | ICD-10-CM | POA: Diagnosis present

## 2018-01-22 DIAGNOSIS — F17210 Nicotine dependence, cigarettes, uncomplicated: Secondary | ICD-10-CM | POA: Diagnosis present

## 2018-01-22 DIAGNOSIS — C9200 Acute myeloblastic leukemia, not having achieved remission: Secondary | ICD-10-CM | POA: Diagnosis present

## 2018-01-22 DIAGNOSIS — G9349 Other encephalopathy: Secondary | ICD-10-CM | POA: Diagnosis present

## 2018-01-22 LAB — COMPREHENSIVE METABOLIC PANEL
ALK PHOS: 55 U/L (ref 38–126)
ALT: 10 U/L — AB (ref 17–63)
AST: 24 U/L (ref 15–41)
Albumin: 2.5 g/dL — ABNORMAL LOW (ref 3.5–5.0)
Anion gap: 11 (ref 5–15)
BUN: 30 mg/dL — AB (ref 6–20)
CALCIUM: 8.3 mg/dL — AB (ref 8.9–10.3)
CO2: 21 mmol/L — AB (ref 22–32)
CREATININE: 1.48 mg/dL — AB (ref 0.61–1.24)
Chloride: 99 mmol/L — ABNORMAL LOW (ref 101–111)
GFR calc Af Amer: 55 mL/min — ABNORMAL LOW (ref >60)
GFR calc non Af Amer: 48 mL/min — ABNORMAL LOW (ref >60)
Glucose, Bld: 139 mg/dL — ABNORMAL HIGH (ref 65–99)
Potassium: 3.9 mmol/L (ref 3.5–5.1)
Sodium: 131 mmol/L — ABNORMAL LOW (ref 135–145)
Total Bilirubin: 1.7 mg/dL — ABNORMAL HIGH (ref 0.3–1.2)
Total Protein: 8 g/dL (ref 6.5–8.1)

## 2018-01-22 LAB — DIFFERENTIAL
BASOS ABS: 0 10*3/uL (ref 0.0–0.1)
BASOS PCT: 0 %
Eosinophils Absolute: 0 10*3/uL (ref 0.0–0.7)
Eosinophils Relative: 0 %
LYMPHS PCT: 79 %
Lymphs Abs: 9.9 10*3/uL — ABNORMAL HIGH (ref 0.7–4.0)
MONO ABS: 1.3 10*3/uL — AB (ref 0.1–1.0)
MONOS PCT: 10 %
Neutro Abs: 1.4 10*3/uL — ABNORMAL LOW (ref 1.7–7.7)
Neutrophils Relative %: 11 %
nRBC: 10 /100 WBC — ABNORMAL HIGH

## 2018-01-22 LAB — PROTIME-INR
INR: 1.3
PROTHROMBIN TIME: 16.1 s — AB (ref 11.4–15.2)

## 2018-01-22 LAB — CBC
HEMATOCRIT: 12.4 % — AB (ref 39.0–52.0)
Hemoglobin: 3.5 g/dL — CL (ref 13.0–17.0)
MCH: 22.2 pg — AB (ref 26.0–34.0)
MCHC: 28.2 g/dL — ABNORMAL LOW (ref 30.0–36.0)
MCV: 78.5 fL (ref 78.0–100.0)
PLATELETS: 43 10*3/uL — AB (ref 150–400)
RBC: 1.58 MIL/uL — ABNORMAL LOW (ref 4.22–5.81)
RDW: 36 % — AB (ref 11.5–15.5)
WBC: 12.6 10*3/uL — AB (ref 4.0–10.5)

## 2018-01-22 LAB — HEMOGLOBIN AND HEMATOCRIT, BLOOD
HCT: 13.7 % — ABNORMAL LOW (ref 39.0–52.0)
Hemoglobin: 3.9 g/dL — CL (ref 13.0–17.0)

## 2018-01-22 LAB — I-STAT TROPONIN, ED: Troponin i, poc: 0 ng/mL (ref 0.00–0.08)

## 2018-01-22 LAB — APTT: aPTT: 42 seconds — ABNORMAL HIGH (ref 24–36)

## 2018-01-22 LAB — PREPARE RBC (CROSSMATCH)

## 2018-01-22 LAB — POC OCCULT BLOOD, ED: FECAL OCCULT BLD: POSITIVE — AB

## 2018-01-22 MED ORDER — THIAMINE HCL 100 MG/ML IJ SOLN
100.0000 mg | Freq: Once | INTRAMUSCULAR | Status: DC
  Filled 2018-01-22: qty 2

## 2018-01-22 MED ORDER — AZITHROMYCIN 500 MG IV SOLR
500.0000 mg | Freq: Once | INTRAVENOUS | Status: AC
Start: 2018-01-22 — End: 2018-01-23
  Administered 2018-01-23: 500 mg via INTRAVENOUS
  Filled 2018-01-22: qty 500

## 2018-01-22 MED ORDER — SODIUM CHLORIDE 0.9 % IV SOLN
80.0000 mg | Freq: Once | INTRAVENOUS | Status: AC
  Administered 2018-01-22: 80 mg via INTRAVENOUS
  Filled 2018-01-22: qty 80

## 2018-01-22 MED ORDER — IOPAMIDOL (ISOVUE-300) INJECTION 61%
INTRAVENOUS | Status: AC
  Filled 2018-01-22: qty 100

## 2018-01-22 MED ORDER — OCTREOTIDE LOAD VIA INFUSION
50.0000 ug | Freq: Once | INTRAVENOUS | Status: AC
  Administered 2018-01-22: 50 ug via INTRAVENOUS
  Filled 2018-01-22: qty 25

## 2018-01-22 MED ORDER — SODIUM CHLORIDE 0.9 % IV SOLN
50.0000 ug/h | INTRAVENOUS | Status: DC
  Administered 2018-01-22 – 2018-01-25 (×5): 50 ug/h via INTRAVENOUS
  Filled 2018-01-22 (×12): qty 1

## 2018-01-22 MED ORDER — SODIUM CHLORIDE 0.9 % IV SOLN: 10.0000 mL/h | Freq: Once | INTRAVENOUS | Status: DC

## 2018-01-22 MED ORDER — SODIUM CHLORIDE 0.9 % IV SOLN
8.0000 mg/h | INTRAVENOUS | Status: AC
  Administered 2018-01-22 – 2018-01-25 (×7): 8 mg/h via INTRAVENOUS
  Filled 2018-01-22 (×9): qty 80

## 2018-01-22 MED ORDER — IOPAMIDOL (ISOVUE-300) INJECTION 61%
80.0000 mL | Freq: Once | INTRAVENOUS | Status: AC | PRN
  Administered 2018-01-22: 80 mL via INTRAVENOUS

## 2018-01-22 MED ORDER — FOLIC ACID 5 MG/ML IJ SOLN
1.0000 mg | Freq: Every day | INTRAMUSCULAR | Status: DC
  Administered 2018-01-22 – 2018-01-29 (×8): 1 mg via INTRAVENOUS
  Filled 2018-01-22 (×10): qty 0.2

## 2018-01-22 MED ORDER — SODIUM CHLORIDE 0.9 % IV SOLN
1.0000 g | Freq: Once | INTRAVENOUS | Status: AC
  Administered 2018-01-23: 1 g via INTRAVENOUS
  Filled 2018-01-22: qty 10

## 2018-01-22 NOTE — ED Provider Notes (Signed)
[place] EMERGENCY DEPARTMENT Provider Note   CSN: [id number] Arrival date & time: 01/28/2018  1502     History   Chief Complaint Chief Complaint  Patient presents with  . Altered Mental Status  . Stroke Symptoms    HPI   Blood pressure (!) 119/43, pulse 99, temperature 98.7 F (37.1 C), temperature source Oral, resp. rate 16, height 5\' 3"  (1.6 m), weight 59 kg (130 lb), SpO2 100 %.  Tommy Bentley is a 67 y.o. male with no prior medical care, history of occasional alcohol use and regular tobacco use brought in by grandniece who gives the history and translates [facility] yard) patient lives with family members, 2 weeks ago they noticed that he stopped smoking, stopped eating dinner with the family and took his dinners in his room, he started having slurred speech and difficulty with balance where he was holding onto things to walk.  3 days ago he stopped eating completely, he started urinating and defecating on himself and he stopped talking completely.  He was not complaining of anything when he was verbal, he was seen for flu at urgent care 1 month ago and started on Tamiflu, he was complaining that he was chilled but other than that no other complaints.  Level 5 caveat secondary to altered mental status and difficulty by language barrier.  Past Medical History:  Diagnosis Date  . Hard of hearing     There are no active problems to display for this patient.   History reviewed. No pertinent surgical history.      Home Medications    Prior to Admission medications   Medication Sig Start Date End Date Taking? Authorizing Provider  diclofenac (VOLTAREN) 50 MG EC tablet Take 1 tablet (50 mg total) by mouth 3 (three) times daily as needed for moderate pain. Patient not taking: Reported on 01/31/2018 09/17/15   Melony Overly, MD  Diclofenac Sodium & Capsaicin 1.5 & 0.025 % THPK Apply 1 application topically 2 (two) times daily. Patient not taking: Reported on 02/07/2018  04/21/16   Konrad Felix, PA  gabapentin (NEURONTIN) 300 MG capsule Take 1 capsule (300 mg total) by mouth at bedtime. Patient not taking: Reported on 01/24/2018 09/17/15   Melony Overly, MD  predniSONE (DELTASONE) 50 MG tablet Take 1 pill daily for 5 days. Patient not taking: Reported on 02/09/2018 09/17/15   Melony Overly, MD    Family History No family history on file.  Social History Social History   Tobacco Use  . Smoking status: Current Every Day Smoker    Packs/day: 0.33  . Smokeless tobacco: Never Used  Substance Use Topics  . Alcohol use: Yes    Comment: Occasional  . Drug use: No     Allergies   Patient has no known allergies.   Review of Systems Review of Systems  Unable to perform ROS: Mental status change      Physical Exam Updated Vital Signs BP (!) 121/55   Pulse (!) 109   Temp 98.7 F (37.1 C) (Oral)   Resp 16   Ht 5\' 3"  (1.6 m)   Wt 59 kg (130 lb)   SpO2 100%   BMI 23.03 kg/m   Physical Exam  Constitutional: He is oriented to person, place, and time. He appears well-developed and well-nourished. No distress.  HENT:  Head: Normocephalic and atraumatic.  Mouth/Throat: Oropharynx is clear and moist.  ++ conjunctival pallor  Eyes: Pupils are equal, round, and reactive to  light. Conjunctivae and EOM are normal.  Neck: Normal range of motion.  Cardiovascular: Normal rate, regular rhythm and intact distal pulses.  Pulmonary/Chest: Effort normal and breath sounds normal.  Abdominal: Soft. There is no tenderness.  Genitourinary:  Genitourinary Comments: DRE is chaperoned by nurse: No rashes or lesions, normal rectal tone, normal stool color  Musculoskeletal: Normal range of motion.  Neurological: He is alert and oriented to person, place, and time.  Follows simple commands,  Right-sided facial droop, pupils equal round and reactive to light, tongue is grossly midline, will not extend.  Right upper extremity weakness with pronator drift.  Can lift  both lower extremities up off the bed, strength grossly intact to the bilateral lower extremities.      Skin: He is not diaphoretic.  Psychiatric: He has a normal mood and affect.  Nursing note and vitals reviewed.    ED Treatments / Results  Labs (all labs ordered are listed, but only abnormal results are displayed) Labs Reviewed  PROTIME-INR - Abnormal; Notable for the following components:      Result Value   Prothrombin Time 16.1 (*)    All other components within normal limits  APTT - Abnormal; Notable for the following components:   aPTT 42 (*)    All other components within normal limits  CBC - Abnormal; Notable for the following components:   WBC 12.6 (*)    RBC 1.58 (*)    Hemoglobin 3.5 (*)    HCT 12.4 (*)    MCH 22.2 (*)    MCHC 28.2 (*)    RDW 36.0 (*)    Platelets 43 (*)    All other components within normal limits  DIFFERENTIAL - Abnormal; Notable for the following components:   nRBC 10 (*)    Neutro Abs 1.4 (*)    Lymphs Abs 9.9 (*)    Monocytes Absolute 1.3 (*)    All other components within normal limits  COMPREHENSIVE METABOLIC PANEL - Abnormal; Notable for the following components:   Sodium 131 (*)    Chloride 99 (*)    CO2 21 (*)    Glucose, Bld 139 (*)    BUN 30 (*)    Creatinine, Ser 1.48 (*)    Calcium 8.3 (*)    Albumin 2.5 (*)    ALT 10 (*)    Total Bilirubin 1.7 (*)    GFR calc non Af Amer 48 (*)    GFR calc Af Amer 55 (*)    All other components within normal limits  HEMOGLOBIN AND HEMATOCRIT, BLOOD - Abnormal; Notable for the following components:   Hemoglobin 3.9 (*)    HCT 13.7 (*)    All other components within normal limits  POC OCCULT BLOOD, ED - Abnormal; Notable for the following components:   Fecal Occult Bld POSITIVE (*)    All other components within normal limits  I-STAT TROPONIN, ED  I-STAT CHEM 8, ED  CBG MONITORING, ED  CBG MONITORING, ED  TYPE AND SCREEN  PREPARE RBC (CROSSMATCH)  PREPARE RBC (CROSSMATCH)    ABO/RH  PREPARE RBC (CROSSMATCH)    EKG EKG Interpretation  Date/Time:  Sunday January 22 2018 15:29:02 EDT Ventricular Rate:  97 PR Interval:  120 QRS Duration: 72 QT Interval:  366 QTC Calculation: 464 R Axis:   61 Text Interpretation:  Normal sinus rhythm Septal infarct , age undetermined ST & T wave abnormality, consider lateral ischemia Abnormal ECG NO STEMI No old tracing to compare Confirmed by  Addison Lank [phone]) on 02/12/2018 4:35:13 PM   Radiology Ct Head Wo Contrast  Result Date: 02/06/2018 CLINICAL DATA:  Mental status changes with loss of appetite and gait disturbance. Right-sided facial droop. Incontinence. EXAM: CT HEAD WITHOUT CONTRAST TECHNIQUE: Contiguous axial images were obtained from the base of the skull through the vertex without intravenous contrast. COMPARISON:  None. FINDINGS: Brain: There chronic small-vessel changes of the pons. No focal cerebellar finding. Cerebral hemispheres show focal low density in the right occipital lobe and within scattered areas of the deep white matter of the left hemisphere. The findings are worrisome for subacute infarctions. These could be embolic or watershed. No evidence of mass lesion, hemorrhage, hydrocephalus or extra-axial collection. Vascular: There is atherosclerotic calcification of the major vessels at the base of the brain. Skull: Negative Sinuses/Orbits: Clear/normal Other: None IMPRESSION: Areas of low-density in the right occipital lobe and within the left hemispheric white matter worrisome for acute/subacute infarctions. No hemorrhage or mass effect. The infarctions could be embolic or watershed. Electronically Signed   By: Nelson Chimes M.D.   On: 02/01/2018 16:08   Ct Abdomen Pelvis W Contrast  Result Date: 01/21/2018 CLINICAL DATA:  67 y/o M; patient stopped eating 3 days ago. Non communicative. Question GI bleed. EXAM: CT ABDOMEN AND PELVIS WITH CONTRAST TECHNIQUE: Multidetector CT imaging of the abdomen and pelvis was  performed using the standard protocol following bolus administration of intravenous contrast. CONTRAST:  52mL ISOVUE-300 IOPAMIDOL (ISOVUE-300) INJECTION 61% COMPARISON:  None. FINDINGS: Lower chest: Small foci of consolidation in the right lower and middle lobe. Calcified pleural plaque at the right dome of diaphragm. Hepatobiliary: Calcified subcentimeter granuloma in right lobe of liver. Otherwise no focal liver abnormality is seen. No gallstones, gallbladder wall thickening, or biliary dilatation. Pancreas: Unremarkable. No pancreatic ductal dilatation or surrounding inflammatory changes. Spleen: Normal in size without focal abnormality. Adrenals/Urinary Tract: Adrenal glands are unremarkable. Kidneys are normal, without renal calculi, focal lesion, or hydronephrosis. Bladder is unremarkable. Stomach/Bowel: Stomach is within normal limits. Appendix appears normal. No evidence of bowel wall thickening, distention, or inflammatory changes. Vascular/Lymphatic: Severe calcific atherosclerosis of the abdominal aorta with bilateral common iliac stenosis. Reproductive: Moderate prostate enlargement. Other: No abdominal wall hernia or abnormality. No abdominopelvic ascites. Musculoskeletal: No fracture is seen. IMPRESSION: 1. Small foci of consolidation in the right middle and lower lobe may represent pneumonia. 2. Severe calcific atherosclerosis of abdominal aorta and iliofemoral arteries. 3. Moderate prostate enlargement. 4. No acute process of abdomen or pelvis identified. Electronically Signed   By: Kristine Garbe M.D.   On: 01/16/2018 22:10    Procedures Procedures (including critical care time)  CRITICAL CARE Performed by: Elmyra Ricks Jamy Whyte   Total critical care time: 60 minutes  Critical care time was exclusive of separately billable procedures and treating other patients.  Critical care was necessary to treat or prevent imminent or life-threatening deterioration.  Critical care was time  spent personally by me on the following activities: development of treatment plan with patient and/or surrogate as well as nursing, discussions with consultants, evaluation of patient's response to treatment, examination of patient, obtaining history from patient or surrogate, ordering and performing treatments and interventions, ordering and review of laboratory studies, ordering and review of radiographic studies, pulse oximetry and re-evaluation of patient's condition.   Medications Ordered in ED Medications  iopamidol (ISOVUE-300) 61 % injection (has no administration in time range)  octreotide (SANDOSTATIN) 2 mcg/mL load via infusion 50 mcg (50 mcg Intravenous Bolus from Bag 02/01/2018 2105)  And  octreotide (SANDOSTATIN) 500 mcg in sodium chloride 0.9 % 250 mL (2 mcg/mL) infusion (50 mcg/hr Intravenous New Bag/Given 02/12/2018 2106)  pantoprazole (PROTONIX) 80 mg in sodium chloride 0.9 % 250 mL (0.32 mg/mL) infusion (has no administration in time range)  thiamine (B-1) injection 100 mg (has no administration in time range)  folic acid injection 1 mg (1 mg Intravenous Given 02/10/2018 2244)  0.9 %  sodium chloride infusion (has no administration in time range)  cefTRIAXone (ROCEPHIN) 1 g in sodium chloride 0.9 % 100 mL IVPB (has no administration in time range)  azithromycin (ZITHROMAX) 500 mg in sodium chloride 0.9 % 250 mL IVPB (has no administration in time range)  0.9 %  sodium chloride infusion (has no administration in time range)  pantoprazole (PROTONIX) 80 mg in sodium chloride 0.9 % 100 mL IVPB (0 mg Intravenous Stopped 02/01/2018 2243)  iopamidol (ISOVUE-300) 61 % injection 80 mL (80 mLs Intravenous Contrast Given 02/13/2018 2137)     Initial Impression / Assessment and Plan / ED Course  I have reviewed the triage vital signs and the nursing notes.  Pertinent labs & imaging results that were available during my care of the patient were reviewed by me and considered in my medical decision making  (see chart for details).     Vitals:   02/04/2018 1915 02/03/2018 1930 01/21/2018 2000 02/12/2018 2030  BP: (!) 118/56 (!) 130/57 (!) 122/59 (!) 121/55  Pulse: (!) 120 (!) 103 (!) 109   Resp: 19 16 16    Temp:      TempSrc:      SpO2: 100% 100% 100%   Weight:      Height:        Medications  iopamidol (ISOVUE-300) 61 % injection (has no administration in time range)  octreotide (SANDOSTATIN) 2 mcg/mL load via infusion 50 mcg (50 mcg Intravenous Bolus from Bag 01/16/2018 2105)    And  octreotide (SANDOSTATIN) 500 mcg in sodium chloride 0.9 % 250 mL (2 mcg/mL) infusion (50 mcg/hr Intravenous New Bag/Given 01/31/2018 2106)  pantoprazole (PROTONIX) 80 mg in sodium chloride 0.9 % 250 mL (0.32 mg/mL) infusion (has no administration in time range)  thiamine (B-1) injection 100 mg (has no administration in time range)  folic acid injection 1 mg (1 mg Intravenous Given 01/24/2018 2244)  0.9 %  sodium chloride infusion (has no administration in time range)  cefTRIAXone (ROCEPHIN) 1 g in sodium chloride 0.9 % 100 mL IVPB (has no administration in time range)  azithromycin (ZITHROMAX) 500 mg in sodium chloride 0.9 % 250 mL IVPB (has no administration in time range)  0.9 %  sodium chloride infusion (has no administration in time range)  pantoprazole (PROTONIX) 80 mg in sodium chloride 0.9 % 100 mL IVPB (0 mg Intravenous Stopped 01/24/2018 2243)  iopamidol (ISOVUE-300) 61 % injection 80 mL (80 mLs Intravenous Contrast Given 02/03/2018 2137)     Tommy Bentley is 67 y.o. male presenting with strokelike symptoms, initial onset approximately 14 days it appears he had another insult approximately 3 days ago.  Patient with right-sided facial droop, right-sided pronator drift.  He is nonverbal and defecating and urinating on himself.  CT consistent with acute infarct in the right occipital lobe and within the left hemispheric white matter.  Neurology consult from Dr. Leonel Ramsay appreciated: States this may be embolic versus  hypoperfusion.  He will come to evaluate the patient.  Critical result of a hemoglobin of 3.2 from lab, will check a H&H but I  do feel that this is accurate, will transfuse 2 units.  Per his grandniece the parents who live with this patient states that he did have some bloody stool.  Given the lack of previous medical examinations, alcohol use, will treat for GI bleed and possible variceal bleed as well given the history of heavy alcohol use.  Per his grandniece he used to drink heavily but he cut back on this starting several months ago.  She states that he drank multiple beers multiple times a day that he did not drink liquor.  According to the family member there is been no known vomiting or hematemesis, however will cover for a variceal bleed with octreotide, MD informed of critical results and plan.  Recommends adding thiamine and folic acid.  Hemoglobin verified to be low based on repeat H&H.  Patient is transfused 2 units.  CT abdomen pelvis with no acute findings, they noted a possible pneumonia, will cover with Rocephin and azithromycin.  No signs of diverticulosis.  Unassigned admission to internal medicine teaching service, we will consult gastroenterology at their request.  Patient will also be put in for 2 more units packed red blood cells.   Discussed with gastroenterologist Dr. Benson Norway who agrees with current care plan and patient will be seen tomorrow.  Final Clinical Impressions(s) / ED Diagnoses   Final diagnoses:  Cerebrovascular accident (CVA), unspecified mechanism ([place])  Gastrointestinal hemorrhage, unspecified gastrointestinal hemorrhage type    ED Discharge Orders    None       Monico Blitz, Hershal Coria 02/01/2018 2312

## 2018-01-22 NOTE — ED Notes (Signed)
Pt in MRI.

## 2018-01-22 NOTE — ED Provider Notes (Signed)
Medical screening examination/treatment/procedure(s) were conducted as a shared visit with non-physician practitioner(s) and myself.  I personally evaluated the patient during the encounter. Briefly, the patient is a 66 y.o. male here with 2 weeks of ataxia and slurred speech and 3 days AMS. Found to have acute/subacute CVA on CT. Labs also revealed significant anemia, which after further discussing appears to be result of GI bleed. HDS. Transfusion started in ED.  Will be admitted for further work up and management.    EKG Interpretation  Date/Time:  Sunday January 22 2018 15:29:02 EDT Ventricular Rate:  97 PR Interval:  120 QRS Duration: 72 QT Interval:  366 QTC Calculation: 464 R Axis:   61 Text Interpretation:  Normal sinus rhythm Septal infarct , age undetermined ST & T wave abnormality, consider lateral ischemia Abnormal ECG NO STEMI No old tracing to compare Confirmed by Addison Lank [id number] on 01/21/2018 4:35:13 PM           Cardama, Grayce Sessions, MD 01/23/18 1549

## 2018-01-22 NOTE — ED Notes (Signed)
After stroke swallow pt ate few french fries that niece gave him and drank few sips of water.

## 2018-01-22 NOTE — ED Notes (Signed)
Pt going to CT and then to treatment room.

## 2018-01-22 NOTE — ED Triage Notes (Addendum)
Great niece with pt reports the family noticed he stopped smoking 14 days ago.  He lives with her parents (his niece).  They also noticed that he quit coming to the table to eat and was eating in his room.  Over the past 3 days he has quit eating, unsteady gait, not talking, R sided facial droop, and drooling.  Pt has R arm drift.  Incontinent of bowel and bladder in his bed.  Pt has dried stool on his hands on arrival.

## 2018-01-22 NOTE — Consult Note (Signed)
Neurology Consultation Reason for Consult: Stroke Referring Physician: Cardama, P  CC: Stroke  History is obtained from: Patient  HPI: Tommy Bentley is a 67 y.o. male with no known past medical history as he does not see a primary care physician who presents with 2 weeks of unsteadiness and 3 days of aphasia with right-sided weakness.  His family states that he has not been walking very well for a couple of weeks and then 3 days ago he stops talking and has not been moving his right side as well.  He was also urinating and defecating on himself and this is what prompted family to seek care in the emergency department today.  Of note, he is [place] only speaking lives with his sisters and grand-nieces were the ones to translate.  LKW: 2 weeks ago tpa given?: no, out of window Premorbid modified rankin scale: 0   ROS:   Unable to obtain due to altered mental status.   Past Medical History:  Diagnosis Date  . Hard of hearing      Family history: Unknown and the parents, but a history of stroke in his sisters   Social History:  reports that he has been smoking.  He has been smoking about 0.33 packs per day. He has never used smokeless tobacco. He reports that he drinks alcohol. He reports that he does not use drugs.   Exam: Current vital signs: BP (!) 119/43 (BP Location: Left Arm)   Pulse 99   Temp 98.7 F (37.1 C) (Oral)   Resp 16   Ht 5\' 3"  (1.6 m)   Wt 59 kg (130 lb)   SpO2 100%   BMI 23.03 kg/m  Vital signs in last 24 hours: Temp:  [98.7 F (37.1 C)] 98.7 F (37.1 C) (04/07 1512) Pulse Rate:  [99] 99 (04/07 1512) Resp:  [16] 16 (04/07 1512) BP: (119)/(43) 119/43 (04/07 1512) SpO2:  [100 %] 100 % (04/07 1512) Weight:  [59 kg (130 lb)] 59 kg (130 lb) (04/07 1513)   Physical Exam  Constitutional: Appears older than stated age Psych: Affect appropriate to situation Eyes: No scleral injection HENT: No OP obstrucion Head: Normocephalic.  Cardiovascular: Normal rate  and regular rhythm.  Respiratory: Effort normal, non-labored breathing GI: Soft.  No distension. There is no tenderness.  Skin: WDI  Neuro: Mental Status: Patient is awake, alert, he does not follow commands, when asked to stick out his tongue he opens his mouth but does not protrude his tongue.  Cranial Nerves: II: He blinks to threat on the left but not right pupils are equal, round, and reactive to light.   III,IV, VI: EOMI without ptosis or diploplia.  V: He responds to facial stimulation bilaterally VII: Facial movement with right facial weakness.  VIII, X, XI, XII: Unable to assess secondary to patient's altered mental status.  Motor: He moves all extremities spontaneously, but does not move the right side is much as the left, does not cooperate with formal testing. Sensory: He withdraws from noxious stimulation in all 4 extremities Cerebellar: He does not perform  I have reviewed labs in epic and the results pertinent to this consultation are: Elevated creatinine at 1.48, low albumin at 2.5, mild hyponatremia at 131  Hemoglobin 3.5!  I have reviewed the images obtained: CT head-infarcts in the right occipital lobe and left hemisphere.  Impression: 67 year old male with multifocal infarcts.  I wonder about asymmetric hypotensive episode given low hemoglobin, he will need vascular imaging.  He is  also the window for any type of intervention given that he has had his current set of symptoms for 3 days.  He will need further workup both from a medical as well as neurological standpoint.   Recommendations: 1. HgbA1c, fasting lipid panel 2. MRI, MRA  of the brain without contrast 3. Frequent neuro checks 4. Echocardiogram 5.  MRA neck with contrast 6. Prophylactic therapy-Antiplatelet med: Aspirin - dose 325mg  PO or 300mg  PR 7. Risk factor modification 8. Telemetry monitoring 9. PT consult, OT consult, Speech consult 10. please page stroke NP  Or  PA  Or MD  from 8am -4 pm  as this patient will be followed by the stroke team at this point.   You can look them up on [email or web address]      Roland Rack, MD [facility] [phone]  If 7pm- 7am, please page neurology on call as listed in [place].

## 2018-01-22 NOTE — H&P (Signed)
Date: 01/23/2018               Patient Name:  Tommy Bentley MRN: [id number]  DOB: [birth year] Age / Sex: 67 y.o., male   PCP: Patient, No Pcp Per         Medical Service: Internal Medicine Teaching Service         Attending Physician: Dr. Aldine Contes, MD    First Contact: Dr. Vickki Muff Pager: [phone]  Second Contact: Dr. Kalman Shan Pager: [phone]       After Hours (After 5p/  First Contact Pager: [phone]  weekends / holidays): Second Contact Pager: [phone]   Chief Complaint: Aphasia, decreased PO intake, and R sided weakness for 3 days  History of Present Illness:  Tommy Bentley is 67 yo with no significant PMH who was brought to the ED by family for evaluation of a 2 week history of weakness and 3 day history of of aphasia and right-sided weakness. History obtained from family members, as patient speaks no english at baseline and has not spoken words in any language for the past 3 days. Patient was in usual state of health until about 3 weeks ago. At that time patient experienced cough, general malaise, and fevers. He went to urgent care, tested positive for influenza, and completed 5 days of Tamiflu. The patient's family states those initial symptoms resolved but the patient remained weak after treatment. They first knew there was a problem because the patient stopped smoking and started to become more withdrawn, eating meals in his room. 3 days ago the patient stopped speaking entirely, stopped eating/drinking, became incontinent of bowel/bladder, and developed R-sided upper extremity weakness and facial droop. AT this time the patient became unable to assist with his care, which represented a significant change from baseline as patient was previously independent with all ADL's. Patient's caregivers noticed blood in the toilet bowl about 2 days prior to presentation but patient could not articulate where the blood was coming from. The morning of presentation the patient's care  givers noticed dark black stools when he went to the bathroom. Patient's caregivers states that the patient did not complain of hematemesis, chest pain, shortness of breath, abdominal pain, difficulties with urination/bowel movements, and unintentional weight loss.  Upon arrival to the ED the patient was afebrile, tachycardic to 120, normotensive (110-120s/50s), and saturating >100% on room air. Patient CBC was significant for WBC of 12.6 (absolute lymphocytes =9.9), Hgb of 3.5, with repeat of 3.9 (MCV = 78.5) and platelets of 43. His FOBT was positive. CMP remarkable for Na = 131, CO2 = 21, BUN = 30, Cr 1.48, albumin of 2.5, and T bilirubin of 1.7. AST/ALT = 24 and 10, respectively. CT head showed areas of hypo-attenuation in right occipital and left hemispheric white matter worrisome for acute/subacute infarcts, possible embolic or watershed in nature. Neurology was consulted and ordered MRI brain and MRA head/neck. A CT abdomen and pelvis showed severe atherosclerosis of abdominal and iliofemoral arteries, an enlarged prostate, and small foci of consolidation in RML and RLL that may have represented pneumonia. The patient was started on octreotide for possible variceal bleed, ordered 2 units pRBC's, and started on ceftriaxone/azithromycin for treatment of possible pneumonia. GI was consulted for evaluation of possible upper GI bleed, who agreed with this plan and stated they would see patient in the morning. IMTS was called for admission.   Meds:  No outpatient medications have been marked as taking for the 01/24/2018 encounter [facility] Encounter).  Allergies: Allergies as of 01/29/2018  . (No Known Allergies)   Past Medical History: Past Medical History:  Diagnosis Date  . Hard of hearing    Past Surgical History: No surgical history.   Family History:  No family history of cancer, HTN, diabetes, or heart disease.   Social History:  Patient lives with niece and grand nieces who are primary  care givers. Smokes 2 packs of cigarettes per week and has for <20-30 years. Drank about 4 beers a day until about 6 months ago, when he stopped drinking for unknown reason. No illicit drugs.  Review of Systems: A complete ROS was negative except as per HPI.   Physical Exam: Blood pressure (!) 121/55, pulse (!) 109, temperature 98.7 F (37.1 C), temperature source Oral, resp. rate 16, height '5\' 3"'  (1.6 m), weight 130 lb (59 kg), SpO2 100 %.  Physical Exam  Constitutional:  Elderly man who appears older than stated age laying comfortably in bed, non-diaphoretic and in no acute distress.  HENT:  Oropharynx dry  Eyes: Conjunctivae and EOM are normal. No scleral icterus.  Cardiovascular: Normal rate, regular rhythm and intact distal pulses. Exam reveals no friction rub.  No murmur heard. Respiratory: Effort normal. No respiratory distress. He has no wheezes. He has no rales.  GI: Soft. Bowel sounds are normal. He exhibits no distension. There is no tenderness. There is no rebound.  Musculoskeletal: He exhibits no edema (of bilateral lower extremities) or tenderness (bilateral lower extremities).  Lymphadenopathy:    He has no cervical adenopathy.  Neurological: He is alert.  Patient intermittently follows commands, but does not cooperate with formal strength testing. Patient aphasic throughout exam. Face symmetric. Patient spontaneously moves all four extremities. 3/4 strength in LUE and 2/5 strength in RUE.   Skin: Skin is warm and dry. No rash noted. No erythema.   EKG: personally reviewed my interpretation is normal sinus rhythm. ST depression in leads V3-V6, worrisome for ischemia and no previous EKG for comparison.   Assessment & Plan by Problem: Active Problems:   CVA (cerebral vascular accident) (HCC)  [name] is 67 yo with no significant PMH who was brought to the ED by family for evaluation of a 3 day history of of aphasia and right-sided weakness who was found to have acute CVA  and severe anemia with thrombocytopenia and FOBT+ on arrival. The patient was admitted to the internal medicine teaching service with neurology and gastroenterology consulting. The specific problems addressed during admission are as follows:  Acute CVA: Patient's head CT showed acute/subacute stroke in R occipital lobe and L hemispheric white matter worrisome for acute infarctions. Territory suggests watershed infarcts, which is most likely given patient's critically low hemoglobin/hematocrit. Will further evaluate with brain MRI and MRA of head/neck. Patient has not sought care with the health care system, so no known risk factors for stroke at this time. Will proceed with usual stroke workup to assess for reversible causes of stroke and to help maximize medical therapy for future prevention. -Admit to telemetry -Neurology consulted, recommendations appreciated -F/u Echocardiography and LE ultrasound ordered on admission -Lipid panel, Hgb A1C (BG elevated to 139 on admission, although this is unlikely to be interpretable given severe anemia at present) -PT/OT/SLP consult -Will hold anticoagulation now in the setting of possible ongoing GI bleed  Severe anemia, thrombocytopenia with relative leukocytosis: Patient's Hgb on arrival critically low at 3.6, repeat 3.9. Given patient's history of dark stools, blood in toilet and + FOBT on admission, the  patient's most likely source of bleeding is from GI tract. Patient ordered 2 units in the ED, will give an additional 2 units for a total of 4 units transfused to get Hgb into normal range. Patient's severely low Hgb suggests chronic bleeding, but patient's family is unaware of melena in the past. Patient will likely need EGD/colonoscopy this admission with GI. MCV does not show microcytosis to suggest iron deficiency anemia and chronic bleeding, but this could be falsely elevated in the setting of B12 and/or folate deficiency with chronic alcohol  use/malnutrition. His labs also showed elevated total bilirubin without any evidence of cirrhosis or biliary obstruction. Given this elevated bilirubin, there may be a component of hemolysis contributing to anemia. Will obtain LDH, haptoglobin, and % reticulocytes to examine. Patient also has thrombocytopenia. It's possible that decreased platelet count is just a result of chronic malnutrition or recent viral illness, but it is concerning for TTP/HUS in the context of decreased RBCs (with schistocytes on smear) and possibly elevated Cr. Will hold off on giving platelets for now and save smear for further review. In addition, patient has elevated protein gap on CMP (gap = 5.5), which is concerning for multiple myeloma in the setting of anemia, thrombocytopenia, possible elevated Cr and lymphocytic predominant leukocytosis. It is also possible that this elevated protein gap represents and leukocytosis is a result of recent viral infection, but blood cell malignancy will need to be ruled out with further testing.  -GI consulted, recommendations appreciated -Discontinue antibiotics for pneumonia started in ED -F/u post transfusion H/H -F/u retic count, hemolysis labs, and peripheral smear -F/u iron deficiency testing, RBC folate and B12 -F/u multiple myeloma panel on admission -Folic acid, Q33 daily -Continue protonix and octreotide gtt started in ED until GI reevaluation -Consider repeat CXR in AM to better evaluate pneumonia  Possible AKI: Patient's Cr elevated to 1.48, with unknown baseline. The patient's BUN:Cr ratio elevated >20, suggesting prerenal azotemia in the setting of GI bleed and decreased PO intake. Will provide fluids overnight and continue to trend with IVF replacement. It is possible that the patient has elevated Cr as a result of multiple myeloma as discussed above or the patient has baseline CKD from untreated DM.  -NS @ 75 ml/hr -BMP in AM  FEN/GI: -NPO pending speech evaluation -No  IVF, replace electrolytes as needed -Protonix gtt  VTE Prophylaxis: SCD's while in bed, ambulate as tolerated Code Status: Full  Dispo: Admit patient to Inpatient with expected length of stay greater than 2 midnights.  SignedThomasene Ripple, MD 01/23/2018, 12:53 AM  Pager: [phone]

## 2018-01-22 NOTE — ED Notes (Signed)
Patient transported to CT 

## 2018-01-23 ENCOUNTER — Inpatient Hospital Stay (HOSPITAL_COMMUNITY): Payer: Medicare Other

## 2018-01-23 DIAGNOSIS — F1099 Alcohol use, unspecified with unspecified alcohol-induced disorder: Secondary | ICD-10-CM | POA: Diagnosis not present

## 2018-01-23 DIAGNOSIS — G8191 Hemiplegia, unspecified affecting right dominant side: Secondary | ICD-10-CM | POA: Diagnosis not present

## 2018-01-23 DIAGNOSIS — N400 Enlarged prostate without lower urinary tract symptoms: Secondary | ICD-10-CM | POA: Diagnosis not present

## 2018-01-23 DIAGNOSIS — I629 Nontraumatic intracranial hemorrhage, unspecified: Secondary | ICD-10-CM | POA: Diagnosis not present

## 2018-01-23 DIAGNOSIS — Z452 Encounter for adjustment and management of vascular access device: Secondary | ICD-10-CM | POA: Diagnosis not present

## 2018-01-23 DIAGNOSIS — Z515 Encounter for palliative care: Secondary | ICD-10-CM | POA: Diagnosis not present

## 2018-01-23 DIAGNOSIS — R0489 Hemorrhage from other sites in respiratory passages: Secondary | ICD-10-CM | POA: Diagnosis present

## 2018-01-23 DIAGNOSIS — E872 Acidosis: Secondary | ICD-10-CM | POA: Diagnosis present

## 2018-01-23 DIAGNOSIS — I340 Nonrheumatic mitral (valve) insufficiency: Secondary | ICD-10-CM | POA: Diagnosis not present

## 2018-01-23 DIAGNOSIS — K922 Gastrointestinal hemorrhage, unspecified: Secondary | ICD-10-CM

## 2018-01-23 DIAGNOSIS — J9601 Acute respiratory failure with hypoxia: Secondary | ICD-10-CM | POA: Diagnosis not present

## 2018-01-23 DIAGNOSIS — F17200 Nicotine dependence, unspecified, uncomplicated: Secondary | ICD-10-CM | POA: Diagnosis not present

## 2018-01-23 DIAGNOSIS — D62 Acute posthemorrhagic anemia: Secondary | ICD-10-CM

## 2018-01-23 DIAGNOSIS — Z4682 Encounter for fitting and adjustment of non-vascular catheter: Secondary | ICD-10-CM | POA: Diagnosis not present

## 2018-01-23 DIAGNOSIS — D6182 Myelophthisis: Secondary | ICD-10-CM | POA: Diagnosis not present

## 2018-01-23 DIAGNOSIS — J9690 Respiratory failure, unspecified, unspecified whether with hypoxia or hypercapnia: Secondary | ICD-10-CM | POA: Diagnosis not present

## 2018-01-23 DIAGNOSIS — N179 Acute kidney failure, unspecified: Secondary | ICD-10-CM | POA: Diagnosis not present

## 2018-01-23 DIAGNOSIS — I739 Peripheral vascular disease, unspecified: Secondary | ICD-10-CM

## 2018-01-23 DIAGNOSIS — I63231 Cerebral infarction due to unspecified occlusion or stenosis of right carotid arteries: Secondary | ICD-10-CM | POA: Diagnosis not present

## 2018-01-23 DIAGNOSIS — I63412 Cerebral infarction due to embolism of left middle cerebral artery: Secondary | ICD-10-CM | POA: Diagnosis not present

## 2018-01-23 DIAGNOSIS — Z978 Presence of other specified devices: Secondary | ICD-10-CM | POA: Diagnosis not present

## 2018-01-23 DIAGNOSIS — K921 Melena: Secondary | ICD-10-CM | POA: Diagnosis not present

## 2018-01-23 DIAGNOSIS — D649 Anemia, unspecified: Secondary | ICD-10-CM | POA: Diagnosis not present

## 2018-01-23 DIAGNOSIS — H9190 Unspecified hearing loss, unspecified ear: Secondary | ICD-10-CM | POA: Diagnosis present

## 2018-01-23 DIAGNOSIS — R4701 Aphasia: Secondary | ICD-10-CM | POA: Diagnosis not present

## 2018-01-23 DIAGNOSIS — E871 Hypo-osmolality and hyponatremia: Secondary | ICD-10-CM | POA: Diagnosis present

## 2018-01-23 DIAGNOSIS — I639 Cerebral infarction, unspecified: Secondary | ICD-10-CM

## 2018-01-23 DIAGNOSIS — G9349 Other encephalopathy: Secondary | ICD-10-CM | POA: Diagnosis present

## 2018-01-23 DIAGNOSIS — R262 Difficulty in walking, not elsewhere classified: Secondary | ICD-10-CM | POA: Diagnosis not present

## 2018-01-23 DIAGNOSIS — A419 Sepsis, unspecified organism: Secondary | ICD-10-CM | POA: Diagnosis not present

## 2018-01-23 DIAGNOSIS — Z681 Body mass index (BMI) 19 or less, adult: Secondary | ICD-10-CM | POA: Diagnosis not present

## 2018-01-23 DIAGNOSIS — R05 Cough: Secondary | ICD-10-CM | POA: Diagnosis not present

## 2018-01-23 DIAGNOSIS — I6523 Occlusion and stenosis of bilateral carotid arteries: Secondary | ICD-10-CM

## 2018-01-23 DIAGNOSIS — E43 Unspecified severe protein-calorie malnutrition: Secondary | ICD-10-CM

## 2018-01-23 DIAGNOSIS — D6489 Other specified anemias: Secondary | ICD-10-CM | POA: Diagnosis not present

## 2018-01-23 DIAGNOSIS — D61818 Other pancytopenia: Secondary | ICD-10-CM | POA: Diagnosis present

## 2018-01-23 DIAGNOSIS — Z9889 Other specified postprocedural states: Secondary | ICD-10-CM | POA: Diagnosis not present

## 2018-01-23 DIAGNOSIS — R195 Other fecal abnormalities: Secondary | ICD-10-CM

## 2018-01-23 DIAGNOSIS — D696 Thrombocytopenia, unspecified: Secondary | ICD-10-CM

## 2018-01-23 DIAGNOSIS — R042 Hemoptysis: Secondary | ICD-10-CM | POA: Diagnosis not present

## 2018-01-23 DIAGNOSIS — F17210 Nicotine dependence, cigarettes, uncomplicated: Secondary | ICD-10-CM | POA: Diagnosis not present

## 2018-01-23 DIAGNOSIS — Z7189 Other specified counseling: Secondary | ICD-10-CM | POA: Diagnosis not present

## 2018-01-23 DIAGNOSIS — I6359 Cerebral infarction due to unspecified occlusion or stenosis of other cerebral artery: Secondary | ICD-10-CM | POA: Diagnosis not present

## 2018-01-23 DIAGNOSIS — I5030 Unspecified diastolic (congestive) heart failure: Secondary | ICD-10-CM | POA: Diagnosis present

## 2018-01-23 DIAGNOSIS — R64 Cachexia: Secondary | ICD-10-CM | POA: Diagnosis present

## 2018-01-23 DIAGNOSIS — R918 Other nonspecific abnormal finding of lung field: Secondary | ICD-10-CM | POA: Diagnosis not present

## 2018-01-23 DIAGNOSIS — C9200 Acute myeloblastic leukemia, not having achieved remission: Secondary | ICD-10-CM | POA: Diagnosis not present

## 2018-01-23 DIAGNOSIS — Z66 Do not resuscitate: Secondary | ICD-10-CM | POA: Diagnosis present

## 2018-01-23 DIAGNOSIS — J449 Chronic obstructive pulmonary disease, unspecified: Secondary | ICD-10-CM | POA: Diagnosis not present

## 2018-01-23 DIAGNOSIS — D72820 Lymphocytosis (symptomatic): Secondary | ICD-10-CM | POA: Diagnosis not present

## 2018-01-23 DIAGNOSIS — R6521 Severe sepsis with septic shock: Secondary | ICD-10-CM | POA: Diagnosis not present

## 2018-01-23 DIAGNOSIS — J189 Pneumonia, unspecified organism: Secondary | ICD-10-CM | POA: Diagnosis present

## 2018-01-23 DIAGNOSIS — R0602 Shortness of breath: Secondary | ICD-10-CM | POA: Diagnosis not present

## 2018-01-23 DIAGNOSIS — I63512 Cerebral infarction due to unspecified occlusion or stenosis of left middle cerebral artery: Secondary | ICD-10-CM | POA: Diagnosis present

## 2018-01-23 LAB — COMPREHENSIVE METABOLIC PANEL
ALT: 10 U/L — AB (ref 17–63)
AST: 21 U/L (ref 15–41)
Albumin: 2.3 g/dL — ABNORMAL LOW (ref 3.5–5.0)
Alkaline Phosphatase: 52 U/L (ref 38–126)
Anion gap: 13 (ref 5–15)
BILIRUBIN TOTAL: 1.5 mg/dL — AB (ref 0.3–1.2)
BUN: 30 mg/dL — AB (ref 6–20)
CO2: 17 mmol/L — ABNORMAL LOW (ref 22–32)
CREATININE: 1.49 mg/dL — AB (ref 0.61–1.24)
Calcium: 7.7 mg/dL — ABNORMAL LOW (ref 8.9–10.3)
Chloride: 101 mmol/L (ref 101–111)
GFR calc Af Amer: 55 mL/min — ABNORMAL LOW (ref >60)
GFR, EST NON AFRICAN AMERICAN: 47 mL/min — AB (ref >60)
Glucose, Bld: 142 mg/dL — ABNORMAL HIGH (ref 65–99)
Potassium: 4.1 mmol/L (ref 3.5–5.1)
Sodium: 131 mmol/L — ABNORMAL LOW (ref 135–145)
TOTAL PROTEIN: 7.5 g/dL (ref 6.5–8.1)

## 2018-01-23 LAB — BASIC METABOLIC PANEL
Anion gap: 12 (ref 5–15)
BUN: 30 mg/dL — AB (ref 6–20)
CO2: 18 mmol/L — ABNORMAL LOW (ref 22–32)
CREATININE: 1.39 mg/dL — AB (ref 0.61–1.24)
Calcium: 7.7 mg/dL — ABNORMAL LOW (ref 8.9–10.3)
Chloride: 104 mmol/L (ref 101–111)
GFR calc Af Amer: 59 mL/min — ABNORMAL LOW (ref >60)
GFR, EST NON AFRICAN AMERICAN: 51 mL/min — AB (ref >60)
GLUCOSE: 120 mg/dL — AB (ref 65–99)
Potassium: 4.2 mmol/L (ref 3.5–5.1)
SODIUM: 134 mmol/L — AB (ref 135–145)

## 2018-01-23 LAB — ECHOCARDIOGRAM COMPLETE
HEIGHTINCHES: 63 in
Weight: 2080 oz

## 2018-01-23 LAB — LIPID PANEL
CHOLESTEROL: 73 mg/dL (ref 0–200)
HDL: 14 mg/dL — ABNORMAL LOW (ref >40)
LDL Cholesterol: 44 mg/dL (ref 0–99)
Total CHOL/HDL Ratio: 5.2 RATIO
Triglycerides: 75 mg/dL (ref <150)
VLDL: 15 mg/dL (ref 0–40)

## 2018-01-23 LAB — HEMOGLOBIN AND HEMATOCRIT, BLOOD
HEMATOCRIT: 21.7 % — AB (ref 39.0–52.0)
HEMATOCRIT: 30.9 % — AB (ref 39.0–52.0)
HEMOGLOBIN: 6.9 g/dL — AB (ref 13.0–17.0)
HEMOGLOBIN: 10 g/dL — AB (ref 13.0–17.0)

## 2018-01-23 LAB — VITAMIN B12: VITAMIN B 12: 783 pg/mL (ref 180–914)

## 2018-01-23 LAB — ABO/RH: ABO/RH(D): A POS

## 2018-01-23 LAB — SAVE SMEAR

## 2018-01-23 LAB — URIC ACID: URIC ACID, SERUM: 8.6 mg/dL — AB (ref 4.4–7.6)

## 2018-01-23 LAB — IRON AND TIBC
Iron: 60 ug/dL (ref 45–182)
SATURATION RATIOS: 26 % (ref 17.9–39.5)
TIBC: 231 ug/dL — ABNORMAL LOW (ref 250–450)
UIBC: 171 ug/dL

## 2018-01-23 LAB — LACTATE DEHYDROGENASE: LDH: 376 U/L — ABNORMAL HIGH (ref 98–192)

## 2018-01-23 LAB — RETICULOCYTES
RBC.: 2.72 MIL/uL — AB (ref 4.22–5.81)
RETIC COUNT ABSOLUTE: 73.4 10*3/uL (ref 19.0–186.0)
Retic Ct Pct: 2.7 % (ref 0.4–3.1)

## 2018-01-23 LAB — PREPARE RBC (CROSSMATCH)

## 2018-01-23 LAB — FERRITIN: FERRITIN: 724 ng/mL — AB (ref 24–336)

## 2018-01-23 MED ORDER — ONDANSETRON HCL 4 MG/2ML IJ SOLN: 4.0000 mg | Freq: Four times a day (QID) | INTRAMUSCULAR | Status: DC | PRN

## 2018-01-23 MED ORDER — SODIUM CHLORIDE 0.9 % IV SOLN
Freq: Once | INTRAVENOUS | Status: AC
  Administered 2018-01-23: 17:00:00 via INTRAVENOUS

## 2018-01-23 MED ORDER — SODIUM CHLORIDE 0.9 % IV SOLN: INTRAVENOUS | Status: DC

## 2018-01-23 MED ORDER — SODIUM CHLORIDE 0.9 % IV SOLN
INTRAVENOUS | Status: AC
  Administered 2018-01-23: 12:00:00 via INTRAVENOUS

## 2018-01-23 MED ORDER — SODIUM CHLORIDE 0.9% FLUSH
3.0000 mL | Freq: Two times a day (BID) | INTRAVENOUS | Status: DC
  Administered 2018-01-23 – 2018-02-01 (×16): 3 mL via INTRAVENOUS

## 2018-01-23 MED ORDER — ONDANSETRON HCL 4 MG PO TABS: 4.0000 mg | ORAL_TABLET | Freq: Four times a day (QID) | ORAL | Status: DC | PRN

## 2018-01-23 MED ORDER — SENNOSIDES-DOCUSATE SODIUM 8.6-50 MG PO TABS
1.0000 | ORAL_TABLET | Freq: Every evening | ORAL | Status: DC | PRN
  Administered 2018-01-30 – 2018-02-01 (×3): 1 via ORAL
  Filled 2018-01-23 (×4): qty 1

## 2018-01-23 MED ORDER — THIAMINE HCL 100 MG/ML IJ SOLN
100.0000 mg | Freq: Every day | INTRAMUSCULAR | Status: DC
  Administered 2018-01-23 – 2018-01-29 (×7): 100 mg via INTRAVENOUS
  Filled 2018-01-23: qty 1
  Filled 2018-01-23: qty 2
  Filled 2018-01-23 (×5): qty 1

## 2018-01-23 MED ORDER — ACETAMINOPHEN 325 MG PO TABS
650.0000 mg | ORAL_TABLET | Freq: Four times a day (QID) | ORAL | Status: DC | PRN
  Administered 2018-02-01: 650 mg via ORAL
  Filled 2018-01-23: qty 2

## 2018-01-23 MED ORDER — ACETAMINOPHEN 650 MG RE SUPP
650.0000 mg | Freq: Four times a day (QID) | RECTAL | Status: DC | PRN
  Administered 2018-01-29 – 2018-01-31 (×3): 650 mg via RECTAL
  Filled 2018-01-23 (×3): qty 1

## 2018-01-23 NOTE — Progress Notes (Signed)
  Echocardiogram 2D Echocardiogram has been performed.  Tommy Bentley 01/23/2018, 11:35 AM

## 2018-01-23 NOTE — Progress Notes (Signed)
Initial Nutrition Assessment  DOCUMENTATION CODES:   Severe malnutrition in context of acute illness/injury  INTERVENTION:   - If pt remains NPO after speech evaluation, recommend placement of Cortrak and initiation of enteral nutrition.  Monitor magnesium, potassium, and phosphorus daily for at least 3 days, MD to replete as needed, as pt is at risk for refeeding syndrome given no PO intake 3 days PTA and severe protein-calorie malnutrition.  NUTRITION DIAGNOSIS:   Severe Malnutrition related to acute illness, lethargy/confusion(CVA) as evidenced by mild fat depletion, moderate fat depletion, moderate muscle depletion, severe muscle depletion.  GOAL:   Patient will meet greater than or equal to 90% of their needs  MONITOR:   Diet advancement, Weight trends, Labs  REASON FOR ASSESSMENT:   Malnutrition Screening Tool    ASSESSMENT:   67 year old male with PMH of occasional alcohol use and regular tobacco use who presented to ED with slurred speech, weakness, and difficulty with balance. CT showed acute infarct in R occipital lobe.  Pt non-communicative at time of RD visit. Per H&P, pt with 3 day history of aphasia. Pt does not speak [place]. No family present at time of visit. Spoke with RN.  Per H&P, pt stopped eating 3 days PTA and began needing assistance with all ADL's. Unable to obtain more extensive diet and weight history at this time. No weight history in chart as pt does not regularly see a PCP.  Pt is NPO pending speech evaluation.  Medications reviewed and include: 1 mg folic acid daily, 160 mg thiamine daily, IV Protonix  Labs reviewed: CO2 17, BUN 30, creatinine 1.49, calcium 7.7, hemoglobin 6.9, HCT 21.7  NUTRITION - FOCUSED PHYSICAL EXAM:    Most Recent Value  Orbital Region  Mild depletion  Upper Arm Region  Moderate depletion  Thoracic and Lumbar Region  Moderate depletion  Buccal Region  Mild depletion  Temple Region  Moderate depletion  Clavicle  Bone Region  Moderate depletion  Clavicle and Acromion Bone Region  Moderate depletion  Scapular Bone Region  Unable to assess  Dorsal Hand  No depletion  Patellar Region  Severe depletion  Anterior Thigh Region  Severe depletion  Posterior Calf Region  Severe depletion  Edema (RD Assessment)  None  Hair  Reviewed  Eyes  Reviewed  Mouth  Unable to assess  Skin  Reviewed  Nails  Reviewed       Diet Order:  Diet NPO time specified  EDUCATION NEEDS:   Not appropriate for education at this time  Skin:  Skin Assessment: Reviewed RN Assessment  Last BM:  01/16/2018 black, tarry stools  Height:   Ht Readings from Last 1 Encounters:  02/07/2018 5\' 3"  (1.6 m)    Weight:   Wt Readings from Last 1 Encounters:  02/10/2018 130 lb (59 kg)    Ideal Body Weight:  56.4 kg  BMI:  Body mass index is 23.03 kg/m.  Estimated Nutritional Needs:   Kcal:  1500-1700 kcal/day (25-29 kcal/kg)  Protein:  70-85 grams/day (1.2-1.4 kg/kg)  Fluid:  1.5-1.7 L/day    Gaynell Face, MS, RD, LDN Pager: [phone] Weekend/After Hours: [phone]

## 2018-01-23 NOTE — Progress Notes (Signed)
Blood started at 1633

## 2018-01-23 NOTE — Progress Notes (Signed)
PT Cancellation Note  Patient Details Name: Tommy Bentley MRN: [id number] DOB: [birth year]   Cancelled Treatment:    Reason Eval/Treat Not Completed: Patient not medically ready. Pt HGB is at 3.9 and is still have dark tarry stools. Per PT protocol PT to hold PT eval at this time until HBG increased to atleast 7. Acute PT to return as able when appropriate.  Kittie Plater, PT, DPT Pager #: [phone] Office #: [phone]    [place] 01/23/2018, 7:45 AM

## 2018-01-23 NOTE — Progress Notes (Signed)
   Subjective: Pt not able to communicate well today, seemed to understand our translator but not able to reply, did not follow commands.    Objective:  Vital signs in last 24 hours: Vitals:   01/23/18 0715 01/23/18 0745 01/23/18 0812 01/23/18 0900  BP: 119/62 (!) 123/54 123/62 118/76  Pulse: 90 88 87 89  Resp: '17 18 19 18  '$ Temp:   97.7 F (36.5 C) 98.2 F (36.8 C)  TempSrc:   Oral Oral  SpO2: 100% 100% 100%   Weight:      Height:       Physical Exam  Constitutional: He appears well-developed and well-nourished.  Eyes: Right eye exhibits no discharge. Left eye exhibits no discharge. No scleral icterus.  Cardiovascular: Normal rate, regular rhythm, normal heart sounds and intact distal pulses. Exam reveals no gallop and no friction rub.  No murmur heard. Pulmonary/Chest: Effort normal and breath sounds normal. No respiratory distress. He has no wheezes. He has no rales.  Abdominal: Soft. Bowel sounds are normal. He exhibits no distension and no mass. There is no tenderness. There is no guarding.  Musculoskeletal: He exhibits no edema (no LE edema).  Neurological: He is alert.  Would not follow commands or answer questions    Assessment/Plan:  Active Problems:   CVA (cerebral vascular accident) ([place])  Acute CVA: Left frontal, and parietal, left posterior temporal cortex, punctate foci throughout left frontal, parietal and temporal cortices consistent with acute/early subacute infarction. R occipital lobe subacute to chronic infarction with hemosiderin deposition.  -continuous cardiac monitoring -Neurology consulted, recommendations appreciated -F/u Echocardiography and LE ultrasound ordered on admission -Lipid panel low HDL and LDL, Hgb A1C  pending -PT/OT/SLP consult -Will hold anticoagulation now in the setting of possible ongoing GI bleed   Severe anemia, thrombocytopenia with relative leukocytosis: schistocytes seen on smear but not quantified will review with  hematology, pt also with thrombocytopenia, severe anemia on admission  -GI consulted, recommendations appreciated -F/u post transfusion H/H -F/u retic count, hemolysis labs, and peripheral smear -F/u iron deficiency testing, RBC folate and B12 -F/u multiple myeloma panel on admission -Folic acid pending , I75 normal -Continue protonix and octreotide gtt started in ED until GI reevaluation    ?AKI: Patient's Cr elevated to 1.48, with unknown baseline. The patient's BUN:Cr ratio elevated >20, suggesting prerenal azotemia in the setting of GI bleed and decreased PO intake. Will likely improve with fluid and blood.   -continue to monitor   Dispo: Anticipated discharge in approximately 5-7 day(s).   Katherine Roan, MD 01/23/2018, 12:18 PM Vickki Muff MD PGY-1 Internal Medicine Pager # [phone]

## 2018-01-23 NOTE — Consult Note (Addendum)
Referring Provider: Internal Medicine Teaching Service Primary Care Physician:  Patient, No Pcp Per Primary Gastroenterologist:   Unassigned  Reason for Consultation:  Anemia, heme positive stool  ASSESSMENT AND PLAN:    1. 67 Yo [place] male with profound anemia / thrombocytopenia / reported black stool at home / heme positive stool inpatient. CT scan negative for acute abnormalities. -Needs EGD for further evaluation. NPO after midnight.   -Great niece speaks [place] but out of room. RN will ask her to be present in am to sign consent for EGD -agree with PPI gtt for now.  -Will leave Octreotide going but no evidence for cirrhosis by CT scan. He is mildly coagulopathic which could be related to poor nutrition though.   2. Acute CVA, numerous infarcts possible due to severe anemia with intracranial stenosis. Cardioembolic etiology not excluded. Echo results pending.    3. Thrombocytopenia, severe. Etiology not yet clear. Workup in progress. No evidence for cirrhosis.   4. Malnutrition, albumin 2.3.   5. AKI, improving  5. Elevated ferritin of 724. Acute phase reaction? Normal saturation ratio. -If does have portal HTN on EGD then will need to workup for iron overload as well as other genetic / autoimmune / viral etiologies of liver disease    Attending physician's note   I have taken an interval history, reviewed the chart and examined the patient. I agree with the Advanced Practitioner's note, impression and recommendations. CT reviewed.  67 year old male who does not speak any English admitted with profound anemia, thrombocytopenia, acute CVA.  There is no evidence of liver cirrhosis on CT. Admission hemoglobin 3.5 s/p 5 units of PRBC.  For EGD in a.m.  Being worked up for hematologic etiology.  Tommy Austria, MD    HPI: Tommy Bentley is a 67 y.o. H[place]  male, non English speaking, who present to ED yesterday with slurred speech and gait disturbance. He has Tommy multifocal infarcts on  head imaging.  Labs remarkable for profound anemia and thrombocytopenia with platelets in 40's. Flow cytometry study ordered.  Family just left. Great niece speaks [place] but not present. According to H+P, patient had blood in stools. / dark stools at home. Getting 5th unit of blood right now.     Past Medical History:  Diagnosis Date  . Hard of hearing     History reviewed. No pertinent surgical history.  Prior to Admission medications   Medication Sig Start Date End Date Taking? Authorizing Provider  diclofenac (VOLTAREN) 50 MG EC tablet Take 1 tablet (50 mg total) by mouth 3 (three) times daily as needed for moderate pain. Patient not taking: Reported on 01/16/2018 09/17/15   Melony Overly, MD  Diclofenac Sodium & Capsaicin 1.5 & 0.025 % THPK Apply 1 application topically 2 (two) times daily. Patient not taking: Reported on 02/14/2018 04/21/16   Konrad Felix, PA  gabapentin (NEURONTIN) 300 MG capsule Take 1 capsule (300 mg total) by mouth at bedtime. Patient not taking: Reported on 02/11/2018 09/17/15   Melony Overly, MD  predniSONE (DELTASONE) 50 MG tablet Take 1 pill daily for 5 days. Patient not taking: Reported on 01/26/2018 09/17/15   Melony Overly, MD    Current Facility-Administered Medications  Medication Dose Route Frequency Provider Last Rate Last Dose  . 0.9 %  sodium chloride infusion   Intravenous Continuous Lorella Nimrod, MD 100 mL/hr at 01/23/18 1136    . 0.9 %  sodium chloride infusion   Intravenous Once Hoffman, Jessica Ratliff, DO      .  acetaminophen (TYLENOL) tablet 650 mg  650 mg Oral Q6H PRN Lorella Nimrod, MD       Or  . acetaminophen (TYLENOL) suppository 650 mg  650 mg Rectal Q6H PRN Lorella Nimrod, MD      . folic acid injection 1 mg  1 mg Intravenous Daily Pisciotta, Nicole, PA-C   1 mg at 01/23/18 1137  . octreotide (SANDOSTATIN) 500 mcg in sodium chloride 0.9 % 250 mL (2 mcg/mL) infusion  50 mcg/hr Intravenous Continuous Pisciotta, Nicole, PA-C 25 mL/hr at  01/23/18 0548 50 mcg/hr at 01/23/18 0548  . ondansetron (ZOFRAN) tablet 4 mg  4 mg Oral Q6H PRN Lorella Nimrod, MD       Or  . ondansetron (ZOFRAN) injection 4 mg  4 mg Intravenous Q6H PRN Lorella Nimrod, MD      . pantoprazole (PROTONIX) 80 mg in sodium chloride 0.9 % 250 mL (0.32 mg/mL) infusion  8 mg/hr Intravenous Continuous Pisciotta, Nicole, PA-C 25 mL/hr at 01/23/18 1136 8 mg/hr at 01/23/18 1136  . senna-docusate (Senokot-S) tablet 1 tablet  1 tablet Oral QHS PRN Lorella Nimrod, MD      . sodium chloride flush (NS) 0.9 % injection 3 mL  3 mL Intravenous Q12H Lorella Nimrod, MD   3 mL at 01/23/18 1137  . thiamine (B-1) injection 100 mg  100 mg Intravenous Daily Lorella Nimrod, MD   100 mg at 01/23/18 1137    Allergies as of 02/08/2018  . (No Known Allergies)    No family history on file.  Social History   Socioeconomic History  . Marital status: Married    Spouse name: Not on file  . Number of children: Not on file  . Years of education: Not on file  . Highest education level: Not on file  Occupational History  . Not on file  Social Needs  . Financial resource strain: Not on file  . Food insecurity:    Worry: Not on file    Inability: Not on file  . Transportation needs:    Medical: Not on file    Non-medical: Not on file  Tobacco Use  . Smoking status: Current Every Day Smoker    Packs/day: 0.33  . Smokeless tobacco: Never Used  Substance and Sexual Activity  . Alcohol use: Yes    Comment: Occasional  . Drug use: No  . Sexual activity: Not on file  Lifestyle  . Physical activity:    Days per week: Not on file    Minutes per session: Not on file  . Stress: Not on file  Relationships  . Social connections:    Talks on phone: Not on file    Gets together: Not on file    Attends religious service: Not on file    Active member of club or organization: Not on file    Attends meetings of clubs or organizations: Not on file    Relationship status: Not on file  .  Intimate partner violence:    Fear of current or ex partner: Not on file    Emotionally abused: Not on file    Physically abused: Not on file    Forced sexual activity: Not on file  Other Topics Concern  . Not on file  Social History Narrative  . Not on file    Review of Systems: All systems reviewed and negative except where noted in HPI.  Physical Exam: Vital signs in last 24 hours: Temp:  [97.5 F (36.4 C)-98.6 F (37 C)]  98.2 F (36.8 C) (04/08 1609) Pulse Rate:  [86-120] 87 (04/08 1609) Resp:  [8-26] 18 (04/08 1609) BP: (91-130)/(38-109) 126/109 (04/08 1609) SpO2:  [99 %-100 %] 100 % (04/08 1609) Last BM Date: 01/29/2018 General:   Alert, thin male in NAD Psych:  cooperative.  Eyes:  Pupils equal, sclera clear, no icterus.   Conjunctiva pale. Nose:  No deformity, discharge,  or lesions. Neck:  Supple; no masses Lungs:  Clear throughout to auscultation.   No wheezes, crackles, or rhonchi.  Heart:  Regular rate and rhythm; no murmurs, no edema Abdomen:  Soft, non-distended, nontender, BS active, no palp mass    Rectal:  Incontinent of DARK stool  Msk:  Symmetrical without gross deformities. . Neurologic:  Alert,  grossly normal neurologically. Skin:  Intact without significant lesions or rashes..   Intake/Output from previous day: 04/07 0701 - 04/08 0700 In: 1642.3 [Blood:1192.3; IV Piggyback:450] Out: -  Intake/Output this shift: No intake/output data recorded.  Lab Results: Recent Labs    02/13/2018 1719 02/14/2018 2053 01/23/18 1000  WBC 12.6*  --   --   HGB 3.5* 3.9* 6.9*  HCT 12.4* 13.7* 21.7*  PLT 43*  --   --    BMET Recent Labs    01/17/2018 1719 01/23/18 0205 01/23/18 1000  NA 131* 131* 134*  K 3.9 4.1 4.2  CL 99* 101 104  CO2 21* 17* 18*  GLUCOSE 139* 142* 120*  BUN 30* 30* 30*  CREATININE 1.48* 1.49* 1.39*  CALCIUM 8.3* 7.7* 7.7*   LFT Recent Labs    01/23/18 0205  PROT 7.5  ALBUMIN 2.3*  AST 21  ALT 10*  ALKPHOS 52  BILITOT 1.5*     PT/INR Recent Labs    01/25/2018 1719  LABPROT 16.1*  INR 1.30   Hepatitis Panel No results for input(s): HEPBSAG, HCVAB, HEPAIGM, HEPBIGM in the last 72 hours.   Studies/Results: Ct Head Bentley Contrast  Result Date: 02/11/2018 CLINICAL DATA:  Mental status changes with loss of appetite and gait disturbance. Right-sided facial droop. Incontinence. EXAM: CT HEAD WITHOUT CONTRAST TECHNIQUE: Contiguous axial images were obtained from the base of the skull through the vertex without intravenous contrast. COMPARISON:  None. FINDINGS: Tommy: There chronic small-vessel changes of the pons. No focal cerebellar finding. Cerebral hemispheres show focal low density in the right occipital lobe and within scattered areas of the deep white matter of the left hemisphere. The findings are worrisome for subacute infarctions. These could be embolic or watershed. No evidence of mass lesion, hemorrhage, hydrocephalus or extra-axial collection. Vascular: There is atherosclerotic calcification of the major vessels at the base of the Tommy. Skull: Negative Sinuses/Orbits: Clear/normal Other: None IMPRESSION: Areas of low-density in the right occipital lobe and within the left hemispheric white matter worrisome for acute/subacute infarctions. No hemorrhage or mass effect. The infarctions could be embolic or watershed. Electronically Signed   By: Nelson Chimes M.D.   On: 02/14/2018 16:08   Mr Tommy Bentley Head Bentley Contrast  Result Date: 01/23/2018 CLINICAL DATA:  67 y/o M; per family, patient not walking very well for a couple of weeks and 3 days ago stops talking and has not moving the right side. EXAM: MRI HEAD WITHOUT CONTRAST MRA HEAD WITHOUT CONTRAST MRA NECK WITHOUT CONTRAST TECHNIQUE: Multiplanar, multiecho pulse sequences of the Tommy and surrounding structures were obtained without intravenous contrast. Angiographic images of the Circle of Willis were obtained using MRA technique without intravenous contrast. Angiographic  images of the neck were obtained  using MRA technique without intravenous contrast. Carotid stenosis measurements (when applicable) are obtained utilizing NASCET criteria, using the distal internal carotid diameter as the denominator. COMPARISON:  02/09/2018 CT head. FINDINGS: MRI HEAD FINDINGS Tommy: There are numerous foci of reduced diffusion throughout left frontal and parietal white matter, left posterior temporal cortex, as well as punctate foci scattered throughout the left frontal, parietal, and temporal cortices compatible with acute/early subacute infarction. No associated hemorrhage or mass effect. There are a few foci of diffusion hyperintensity with intermediate diffusion in the right occipital lobe likely a late subacute to chronic infarction. There is curvilinear susceptibility hypointensity overlying the right occipital lobe compatible with hemosiderin deposition. Background of moderate chronic microvascular ischemic changes and parenchymal volume loss of the Tommy. No hydrocephalus, acute extra-axial collection, or effacement of basilar cisterns. Vascular: As below. Skull and upper cervical spine: Normal marrow signal. Sinuses/Orbits: Moderate left maxillary sinus mucosal thickening with a small fluid level. Otherwise negative. Other: None. MRA HEAD FINDINGS Internal carotid arteries: Patent. Lumen irregularity of the right carotid siphon with multiple segments of mild stenosis. Anterior cerebral arteries:  Patent. Middle cerebral arteries: Patent right MCA distribution. The left M2 superior division appears to have a common origin with the left A1 and is occluded proximally (series 453, image 12). The left M2 inferior division is patent. Anterior communicating artery: Patent. Posterior communicating arteries:  Patent. Posterior cerebral arteries:  Patent. Basilar artery: Patent. Left AICA origin moderate to severe stenosis. Vertebral arteries:  Patent. No evidence of high-grade stenosis, large  vessel occlusion, or aneurysm unless noted above. MRA NECK FINDINGS Severe motion artifact. No definite evidence for hemodynamically significant stenosis by NASCET criteria. Aortic arch: Patent. Right common carotid artery: Patent. Right internal carotid artery: Patent. Right vertebral artery: Patent. Left common carotid artery: Patent. Left Internal carotid artery: Patent. Left Vertebral artery: Patent. IMPRESSION: MRI head: 1. Numerous small foci of acute/early subacute infarction are present throughout the left hemisphere, largely in a watershed distribution. 2. Late subacute to chronic hemorrhagic infarct in right occipital lobe. 3. No acute hemorrhage, mass effect, or herniation. 4. Background of moderate chronic microvascular ischemic changes and parenchymal volume loss of the Tommy. MRA head: 1. Left M2 superior division appears to have common origin with left A1 and is occluded proximally. 2. No additional large vessel occlusion of the intracranial circulation identified. MRA neck: Severe motion artifact. Patent carotid systems and vertebral arteries. No definite evidence for hemodynamically significant stenosis by NASCET criteria. These results were called by telephone at the time of interpretation on 01/23/2018 at 12:38 am to Dr. Roland Rack , who verbally acknowledged these results. Electronically Signed   By: Kristine Garbe M.D.   On: 01/23/2018 00:42   Mr Tommy Bentley Neck Bentley Contrast  Result Date: 01/23/2018 CLINICAL DATA:  67 y/o M; per family, patient not walking very well for a couple of weeks and 3 days ago stops talking and has not moving the right side. EXAM: MRI HEAD WITHOUT CONTRAST MRA HEAD WITHOUT CONTRAST MRA NECK WITHOUT CONTRAST TECHNIQUE: Multiplanar, multiecho pulse sequences of the Tommy and surrounding structures were obtained without intravenous contrast. Angiographic images of the Circle of Willis were obtained using MRA technique without intravenous contrast. Angiographic  images of the neck were obtained using MRA technique without intravenous contrast. Carotid stenosis measurements (when applicable) are obtained utilizing NASCET criteria, using the distal internal carotid diameter as the denominator. COMPARISON:  01/31/2018 CT head. FINDINGS: MRI HEAD FINDINGS Tommy: There are numerous foci of reduced diffusion  throughout left frontal and parietal white matter, left posterior temporal cortex, as well as punctate foci scattered throughout the left frontal, parietal, and temporal cortices compatible with acute/early subacute infarction. No associated hemorrhage or mass effect. There are a few foci of diffusion hyperintensity with intermediate diffusion in the right occipital lobe likely a late subacute to chronic infarction. There is curvilinear susceptibility hypointensity overlying the right occipital lobe compatible with hemosiderin deposition. Background of moderate chronic microvascular ischemic changes and parenchymal volume loss of the Tommy. No hydrocephalus, acute extra-axial collection, or effacement of basilar cisterns. Vascular: As below. Skull and upper cervical spine: Normal marrow signal. Sinuses/Orbits: Moderate left maxillary sinus mucosal thickening with a small fluid level. Otherwise negative. Other: None. MRA HEAD FINDINGS Internal carotid arteries: Patent. Lumen irregularity of the right carotid siphon with multiple segments of mild stenosis. Anterior cerebral arteries:  Patent. Middle cerebral arteries: Patent right MCA distribution. The left M2 superior division appears to have a common origin with the left A1 and is occluded proximally (series 453, image 12). The left M2 inferior division is patent. Anterior communicating artery: Patent. Posterior communicating arteries:  Patent. Posterior cerebral arteries:  Patent. Basilar artery: Patent. Left AICA origin moderate to severe stenosis. Vertebral arteries:  Patent. No evidence of high-grade stenosis, large  vessel occlusion, or aneurysm unless noted above. MRA NECK FINDINGS Severe motion artifact. No definite evidence for hemodynamically significant stenosis by NASCET criteria. Aortic arch: Patent. Right common carotid artery: Patent. Right internal carotid artery: Patent. Right vertebral artery: Patent. Left common carotid artery: Patent. Left Internal carotid artery: Patent. Left Vertebral artery: Patent. IMPRESSION: MRI head: 1. Numerous small foci of acute/early subacute infarction are present throughout the left hemisphere, largely in a watershed distribution. 2. Late subacute to chronic hemorrhagic infarct in right occipital lobe. 3. No acute hemorrhage, mass effect, or herniation. 4. Background of moderate chronic microvascular ischemic changes and parenchymal volume loss of the Tommy. MRA head: 1. Left M2 superior division appears to have common origin with left A1 and is occluded proximally. 2. No additional large vessel occlusion of the intracranial circulation identified. MRA neck: Severe motion artifact. Patent carotid systems and vertebral arteries. No definite evidence for hemodynamically significant stenosis by NASCET criteria. These results were called by telephone at the time of interpretation on 01/23/2018 at 12:38 am to Dr. Roland Rack , who verbally acknowledged these results. Electronically Signed   By: Kristine Garbe M.D.   On: 01/23/2018 00:42   Mr Tommy Bentley Contrast  Result Date: 01/23/2018 CLINICAL DATA:  67 y/o M; per family, patient not walking very well for a couple of weeks and 3 days ago stops talking and has not moving the right side. EXAM: MRI HEAD WITHOUT CONTRAST MRA HEAD WITHOUT CONTRAST MRA NECK WITHOUT CONTRAST TECHNIQUE: Multiplanar, multiecho pulse sequences of the Tommy and surrounding structures were obtained without intravenous contrast. Angiographic images of the Circle of Willis were obtained using MRA technique without intravenous contrast. Angiographic  images of the neck were obtained using MRA technique without intravenous contrast. Carotid stenosis measurements (when applicable) are obtained utilizing NASCET criteria, using the distal internal carotid diameter as the denominator. COMPARISON:  01/19/2018 CT head. FINDINGS: MRI HEAD FINDINGS Tommy: There are numerous foci of reduced diffusion throughout left frontal and parietal white matter, left posterior temporal cortex, as well as punctate foci scattered throughout the left frontal, parietal, and temporal cortices compatible with acute/early subacute infarction. No associated hemorrhage or mass effect. There are a few foci of diffusion  hyperintensity with intermediate diffusion in the right occipital lobe likely a late subacute to chronic infarction. There is curvilinear susceptibility hypointensity overlying the right occipital lobe compatible with hemosiderin deposition. Background of moderate chronic microvascular ischemic changes and parenchymal volume loss of the Tommy. No hydrocephalus, acute extra-axial collection, or effacement of basilar cisterns. Vascular: As below. Skull and upper cervical spine: Normal marrow signal. Sinuses/Orbits: Moderate left maxillary sinus mucosal thickening with a small fluid level. Otherwise negative. Other: None. MRA HEAD FINDINGS Internal carotid arteries: Patent. Lumen irregularity of the right carotid siphon with multiple segments of mild stenosis. Anterior cerebral arteries:  Patent. Middle cerebral arteries: Patent right MCA distribution. The left M2 superior division appears to have a common origin with the left A1 and is occluded proximally (series 453, image 12). The left M2 inferior division is patent. Anterior communicating artery: Patent. Posterior communicating arteries:  Patent. Posterior cerebral arteries:  Patent. Basilar artery: Patent. Left AICA origin moderate to severe stenosis. Vertebral arteries:  Patent. No evidence of high-grade stenosis, large  vessel occlusion, or aneurysm unless noted above. MRA NECK FINDINGS Severe motion artifact. No definite evidence for hemodynamically significant stenosis by NASCET criteria. Aortic arch: Patent. Right common carotid artery: Patent. Right internal carotid artery: Patent. Right vertebral artery: Patent. Left common carotid artery: Patent. Left Internal carotid artery: Patent. Left Vertebral artery: Patent. IMPRESSION: MRI head: 1. Numerous small foci of acute/early subacute infarction are present throughout the left hemisphere, largely in a watershed distribution. 2. Late subacute to chronic hemorrhagic infarct in right occipital lobe. 3. No acute hemorrhage, mass effect, or herniation. 4. Background of moderate chronic microvascular ischemic changes and parenchymal volume loss of the Tommy. MRA head: 1. Left M2 superior division appears to have common origin with left A1 and is occluded proximally. 2. No additional large vessel occlusion of the intracranial circulation identified. MRA neck: Severe motion artifact. Patent carotid systems and vertebral arteries. No definite evidence for hemodynamically significant stenosis by NASCET criteria. These results were called by telephone at the time of interpretation on 01/23/2018 at 12:38 am to Dr. Roland Rack , who verbally acknowledged these results. Electronically Signed   By: Kristine Garbe M.D.   On: 01/23/2018 00:42   Ct Abdomen Pelvis W Contrast  Result Date: 01/21/2018 CLINICAL DATA:  67 y/o M; patient stopped eating 3 days ago. Non communicative. Question GI bleed. EXAM: CT ABDOMEN AND PELVIS WITH CONTRAST TECHNIQUE: Multidetector CT imaging of the abdomen and pelvis was performed using the standard protocol following bolus administration of intravenous contrast. CONTRAST:  33mL ISOVUE-300 IOPAMIDOL (ISOVUE-300) INJECTION 61% COMPARISON:  None. FINDINGS: Lower chest: Small foci of consolidation in the right lower and middle lobe. Calcified pleural  plaque at the right dome of diaphragm. Hepatobiliary: Calcified subcentimeter granuloma in right lobe of liver. Otherwise no focal liver abnormality is seen. No gallstones, gallbladder wall thickening, or biliary dilatation. Pancreas: Unremarkable. No pancreatic ductal dilatation or surrounding inflammatory changes. Spleen: Normal in size without focal abnormality. Adrenals/Urinary Tract: Adrenal glands are unremarkable. Kidneys are normal, without renal calculi, focal lesion, or hydronephrosis. Bladder is unremarkable. Stomach/Bowel: Stomach is within normal limits. Appendix appears normal. No evidence of bowel wall thickening, distention, or inflammatory changes. Vascular/Lymphatic: Severe calcific atherosclerosis of the abdominal aorta with bilateral common iliac stenosis. Reproductive: Moderate prostate enlargement. Other: No abdominal wall hernia or abnormality. No abdominopelvic ascites. Musculoskeletal: No fracture is seen. IMPRESSION: 1. Small foci of consolidation in the right middle and lower lobe may represent pneumonia. 2. Severe calcific  atherosclerosis of abdominal aorta and iliofemoral arteries. 3. Moderate prostate enlargement. 4. No acute process of abdomen or pelvis identified. Electronically Signed   By: Kristine Garbe M.D.   On: 01/16/2018 22:10     Tye Savoy, NP-C @  01/23/2018, 4:23 PM  Pager number [phone]

## 2018-01-23 NOTE — Progress Notes (Signed)
SLP Cancellation Note  Patient Details Name: Tommy Bentley MRN: [id number] DOB: 09/07/1951   Cancelled Bedside Swallow Evaluation:       Reason Eval/Treat Not Completed: Medical issues which prohibited therapy - RN indicates pt is poorly responsive and medically unstable, therefore inappropriate for evaluation of swallow function/safety and identification of safe po diet. Will continue efforts as pt status improves.  Celia B. Quentin Ore [facility], CCC-SLP Speech Language Pathologist [phone]  Shonna Chock 01/23/2018, 12:31 PM

## 2018-01-23 NOTE — Progress Notes (Signed)
OT Cancellation Note  Patient Details Name: Tommy Bentley MRN: [id number] DOB: 1951-10-12   Cancelled Treatment:    Reason Eval/Treat Not Completed: Medical issues which prohibited therapy. Pt with HgB 3.9 at this time. Will hold OT evaluation per protocol and check back as appropriate.   Norman Herrlich, MS OTR/L  Pager: Tommy Bentley 01/23/2018, 7:59 AM

## 2018-01-23 NOTE — Progress Notes (Signed)
Pt observed to be coughing intermittently from the beginning of the shift, restless and moaning, pt aphasic, speaks no [place], Doc on call paged and notified, no new orders at this time, said to continue to monitor, pt put on oxygen at [facility], will continue to monitor. Obasogie-Asidi, Yania Bogie Efe

## 2018-01-23 NOTE — Progress Notes (Addendum)
STROKE TEAM PROGRESS NOTE   SUBJECTIVE (INTERVAL HISTORY) No one is at the bedside.  Lying in bed with cover over head. Will awaken to voice and mimic commands. Patient does not speak [place].   OBJECTIVE Vitals:   01/23/18 0715 01/23/18 0745 01/23/18 0812 01/23/18 0900  BP: 119/62 (!) 123/54 123/62 118/76  Pulse: 90 88 87 89  Resp: 17 18 19 18   Temp:   97.7 F (36.5 C) 98.2 F (36.8 C)  TempSrc:   Oral Oral  SpO2: 100% 100% 100%   Weight:      Height:        CBC:  Recent Labs  Lab 02/01/2018 1719 01/21/2018 2053  WBC 12.6*  --   NEUTROABS 1.4*  --   HGB 3.5* 3.9*  HCT 12.4* 13.7*  MCV 78.5  --   PLT 43*  --     Basic Metabolic Panel:  Recent Labs  Lab 01/24/2018 1719 01/23/18 0205  NA 131* 131*  K 3.9 4.1  CL 99* 101  CO2 21* 17*  GLUCOSE 139* 142*  BUN 30* 30*  CREATININE 1.48* 1.49*  CALCIUM 8.3* 7.7*    Lipid Panel:     Component Value Date/Time   CHOL 73 01/23/2018 0029   TRIG 75 01/23/2018 0029   HDL 14 (L) 01/23/2018 0029   CHOLHDL 5.2 01/23/2018 0029   VLDL 15 01/23/2018 0029   LDLCALC 44 01/23/2018 0029   HgbA1c: No results found for: HGBA1C Urine Drug Screen: No results found for: LABOPIA, COCAINSCRNUR, LABBENZ, AMPHETMU, THCU, LABBARB  Alcohol Level No results found for: ETH  IMAGING  Ct Head Wo Contrast 01/19/2018 Areas of low-density in the right occipital lobe and within the left hemispheric white matter worrisome for acute/subacute infarctions. No hemorrhage or mass effect. The infarctions could be embolic or watershed. Electronically Signed   By: Nelson Chimes M.D.   On: 01/24/2018 16:08   Tommy Bentley Wo Contrast 01/23/2018 1. Numerous small foci of acute/early subacute infarction are present throughout the left hemisphere, largely in a watershed distribution. 2. Late subacute to chronic hemorrhagic infarct in right occipital lobe. 3. No acute hemorrhage, mass effect, or herniation. 4. Background of moderate chronic microvascular ischemic  changes and parenchymal volume loss of the Bentley.   Tommy Bentley Head Wo Contrast 01/23/2018  1. Left M2 superior division appears to have common origin with left A1 and is occluded proximally. 2. No additional large vessel occlusion of the intracranial circulation identified. MRA neck: Severe motion artifact. Patent carotid systems and vertebral arteries. No definite evidence for hemodynamically significant stenosis by NASCET criteria.   Tommy Bentley Neck Wo Contrast 01/23/2018 Severe motion artifact. Patent carotid systems and vertebral arteries. No definite evidence for hemodynamically significant stenosis by NASCET criteria.   Ct Abdomen Pelvis W Contrast 02/03/2018  1. Small foci of consolidation in the right middle and lower lobe may represent pneumonia. 2. Severe calcific atherosclerosis of abdominal aorta and iliofemoral arteries. 3. Moderate prostate enlargement. 4. No acute process of abdomen or pelvis identified.   Lower Extremity Venous Dopplers  pending  2D Echocardiogram  pending    PHYSICAL EXAM Mental Status: Patient easily awakens to voice, alert, he does not speak [place] or follow commands in [place]. He will mimic and follow gestures (opens mouth, holds up extremities). He does not protrude his tongue. Cranial Nerves: II: He blinks to threat on the left but not right pupils are equal, round, and reactive to light.   III,IV, VI: EOMI without  ptosis or observable diploplia.  V: He responds to facial stimulation bilaterally.  VII: Facial movement with right facial weakness.  VIII, X, XI, XII: Unable to assess  Motor: He moves all extremities spontaneously, but does not move the right side is much as the left. Will hold BLE off bed, no drift in LUE. Will not hold RUE up independently but does resist gravitySensory: He withdraws from noxious stimulation in all 4 extremities Cerebellar: He does not perform   ASSESSMENT/PLAN Tommy. Tommy Bentley is a 67 y.o. male who has not seen a physician  in years with a known hx of smoking (stopped 14 days ago) and reported occasional ETOH use presenting with a 2 week history of unsteady gait, slurred speech, decreased speech output, R facial, and R arm weakness. Also with bowel & bladder incontinence, hgb 3.5, Cr 1.48, alb 1.5, Na 131.  Stroke:  Acute left MCA infarct in setting of L M2 occlusion along with subacute R occipital infarct and prior R occipital infarct . infarcts possibly embolic vs. Severe anemia in the setting of left M2 occlusion and right carotid siphon stenosis. Workup underway.  Resultant  right hemiparesis, likely aphasia  CT head ow density right occipital lobe and left hemispheric white matter  MRI head numerous left Bentley embolic infarcts. Subacute right hemorrhagic infarct. Small vessel disease. Atrophy.   MRA head L M2 occlusion, right carotid siphon stenosis  MRA neck motion degraded. large arteries appear patent  2D Echo  pending  Lower extremity Dopplers pending  Consider CTA head and neck to look at vasculature if/when pt can tolerate (creatinine elevated)  LDL 44  HgbA1c pending  SCDs for VTE prophylaxis  Diet NPO time specified  No antithrombotic prior to admission, now on No antithrombotic given severe anemia and possible GI bleed  Ongoing aggressive stroke risk factor management  Therapy recommendations:  pending  Disposition:  pending  Other Stroke Risk Factors  Advanced age  Cigarette smoker  Alcohol use, stopped 6 months ago  severe atherosclerosis abdominal aorta and iliofemoral arteries  Other Active Problems  Severe anemia, thrombocytopenia and relative leukocytosis. GI consultation. Has received 4 units packed red blood cells. On octreotide and Protonix drips. Hemoglobin 3.5 ->6.9. Alb 2.5. Na 131. WBC 12.6Lactate dehydrogenase 376 H,  Ferritin 724 H, TIBC 231 L.   Possible acute kidney injury. Creatinine 1.48  Hospital day # 0  Burnetta Sabin, MSN, APRN, ANVP-BC,  AGPCNP-BC Advanced Practice Stroke Nurse [place] for Schedule & Pager information 01/23/2018 1:54 PM   ATTENDING NOTE: I reviewed above note and agree with the assessment and plan. I have made any additions or clarifications directly to the above note. Pt was seen and examined.   67 yo M with Hx of smoker admitted for unsteady gait for 2 weeks, right sided weakness and aphasia for 3 days with b/b dysfunction. CT left MCA and right MCA/PCA infarcts. MRI showed subacute right occipital MCA/PCA infarcts and acute left MCA scattered infarcts. MRA head showed left M2 occlusion and right carotid siphon stenosis. TTE and LE venous doppler pending. LDL 44 and A1C pending.  Exam showed cachectic appearance, aphasic, not following commands (not sure if also language issue), blinking to visual threat on the left, but not on the right, right facial droop, RUE drift and decreased wrist extension. BLE symmetric with pain stimulation. DTR 1+ and babinski not cooperative.   Pt was found to have profound anemia with Hb 3.9 and also thrombopenia with platelet  43. He received blood transfusion and today Hb 6.9. He is severely malnourished. CT abd/pelvis did not show malignancy. Anemia work up underway.   Pt also has evidence of PVD and intracranial stenosis. Smoking likely to be the risk factor. CT abdomen and pelvis showed severe atherosclerosis AA and iliofemoral arteries. MRA head also showed left M2 occlusion and right carotid siphon stenosis. His stroke likely due to severe anemia with intracranial stenosis/occlusion. cardioembolic can not be completely ruled out. However, due to severe anemia and thrombocytopenia, will hold off embolic work up for now. Also holding off antiplatelet at this time. Will follow.   Rosalin Hawking, MD PhD Stroke Neurology 01/23/2018 2:25 PM     To contact Stroke Continuity provider, please refer to [email or web address] After hours, contact General Neurology

## 2018-01-23 NOTE — Progress Notes (Signed)
Called by [facility] RE: MRI with left sided strokes and possible variant anatomy Left M2 occlusion (see detailed rads report). Possible that occlusion is chronic and severe anemia led to watershed strokes.   No acute intervention needed as his LSN is more than 3d ago.  Recs as per Dr. Cecil Cobbs consult. Stroke team to follow in AM.   -- Amie Portland, MD [facility] Pager: [phone] If 7pm to 7am, please call on call as listed on AMION.

## 2018-01-24 ENCOUNTER — Inpatient Hospital Stay (HOSPITAL_COMMUNITY): Payer: Medicare Other

## 2018-01-24 ENCOUNTER — Other Ambulatory Visit (HOSPITAL_COMMUNITY): Payer: Self-pay | Admitting: Radiology

## 2018-01-24 DIAGNOSIS — K922 Gastrointestinal hemorrhage, unspecified: Secondary | ICD-10-CM

## 2018-01-24 DIAGNOSIS — D6182 Myelophthisis: Secondary | ICD-10-CM

## 2018-01-24 DIAGNOSIS — D649 Anemia, unspecified: Secondary | ICD-10-CM

## 2018-01-24 DIAGNOSIS — D6489 Other specified anemias: Secondary | ICD-10-CM

## 2018-01-24 DIAGNOSIS — I639 Cerebral infarction, unspecified: Secondary | ICD-10-CM

## 2018-01-24 DIAGNOSIS — D696 Thrombocytopenia, unspecified: Secondary | ICD-10-CM

## 2018-01-24 DIAGNOSIS — J9601 Acute respiratory failure with hypoxia: Secondary | ICD-10-CM

## 2018-01-24 DIAGNOSIS — D72820 Lymphocytosis (symptomatic): Secondary | ICD-10-CM

## 2018-01-24 LAB — HEMOGLOBIN A1C
Hgb A1c MFr Bld: <4.2 % — ABNORMAL LOW (ref 4.8–5.6)
Mean Plasma Glucose: <74 mg/dL

## 2018-01-24 LAB — RESPIRATORY PANEL BY PCR
Adenovirus: NOT DETECTED
BORDETELLA PERTUSSIS-RVPCR: NOT DETECTED
CHLAMYDOPHILA PNEUMONIAE-RVPPCR: NOT DETECTED
Coronavirus 229E: NOT DETECTED
Coronavirus HKU1: NOT DETECTED
Coronavirus NL63: NOT DETECTED
Coronavirus OC43: NOT DETECTED
Influenza A: NOT DETECTED
Influenza B: NOT DETECTED
MYCOPLASMA PNEUMONIAE-RVPPCR: NOT DETECTED
Metapneumovirus: NOT DETECTED
PARAINFLUENZA VIRUS 3-RVPPCR: NOT DETECTED
PARAINFLUENZA VIRUS 4-RVPPCR: NOT DETECTED
Parainfluenza Virus 1: NOT DETECTED
Parainfluenza Virus 2: NOT DETECTED
Respiratory Syncytial Virus: NOT DETECTED
Rhinovirus / Enterovirus: NOT DETECTED

## 2018-01-24 LAB — CBC
HCT: 30.9 % — ABNORMAL LOW (ref 39.0–52.0)
HEMATOCRIT: 26.3 % — AB (ref 39.0–52.0)
HEMATOCRIT: 24.4 % — AB (ref 39.0–52.0)
HEMOGLOBIN: 10 g/dL — AB (ref 13.0–17.0)
Hemoglobin: 8.4 g/dL — ABNORMAL LOW (ref 13.0–17.0)
Hemoglobin: 7.9 g/dL — ABNORMAL LOW (ref 13.0–17.0)
MCH: 25.5 pg — ABNORMAL LOW (ref 26.0–34.0)
MCH: 25.7 pg — AB (ref 26.0–34.0)
MCH: 26.2 pg (ref 26.0–34.0)
MCHC: 32.4 g/dL (ref 30.0–36.0)
MCHC: 31.9 g/dL (ref 30.0–36.0)
MCHC: 32.4 g/dL (ref 30.0–36.0)
MCV: 78.8 fL (ref 78.0–100.0)
MCV: 80.4 fL (ref 78.0–100.0)
MCV: 80.8 fL (ref 78.0–100.0)
PLATELETS: 60 10*3/uL — AB (ref 150–400)
Platelets: 43 10*3/uL — ABNORMAL LOW (ref 150–400)
Platelets: 35 10*3/uL — ABNORMAL LOW (ref 150–400)
RBC: 3.92 MIL/uL — AB (ref 4.22–5.81)
RBC: 3.27 MIL/uL — ABNORMAL LOW (ref 4.22–5.81)
RBC: 3.02 MIL/uL — ABNORMAL LOW (ref 4.22–5.81)
RDW: 22.6 % — ABNORMAL HIGH (ref 11.5–15.5)
RDW: 22.7 % — AB (ref 11.5–15.5)
RDW: 22.9 % — ABNORMAL HIGH (ref 11.5–15.5)
WBC: 14.1 10*3/uL — ABNORMAL HIGH (ref 4.0–10.5)
WBC: 11 10*3/uL — ABNORMAL HIGH (ref 4.0–10.5)
WBC: 13.8 10*3/uL — AB (ref 4.0–10.5)

## 2018-01-24 LAB — GLUCOSE, CAPILLARY
Glucose-Capillary: 122 mg/dL — ABNORMAL HIGH (ref 65–99)
Glucose-Capillary: 114 mg/dL — ABNORMAL HIGH (ref 65–99)
Glucose-Capillary: 111 mg/dL — ABNORMAL HIGH (ref 65–99)
Glucose-Capillary: 108 mg/dL — ABNORMAL HIGH (ref 65–99)
Glucose-Capillary: 100 mg/dL — ABNORMAL HIGH (ref 65–99)

## 2018-01-24 LAB — COMPREHENSIVE METABOLIC PANEL
ALBUMIN: 2.4 g/dL — AB (ref 3.5–5.0)
ALT: 11 U/L — AB (ref 17–63)
AST: 28 U/L (ref 15–41)
Alkaline Phosphatase: 58 U/L (ref 38–126)
Anion gap: 10 (ref 5–15)
BUN: 27 mg/dL — AB (ref 6–20)
CHLORIDE: 109 mmol/L (ref 101–111)
CO2: 17 mmol/L — AB (ref 22–32)
CREATININE: 1.31 mg/dL — AB (ref 0.61–1.24)
Calcium: 7.7 mg/dL — ABNORMAL LOW (ref 8.9–10.3)
GFR calc Af Amer: >60 mL/min (ref >60)
GFR, EST NON AFRICAN AMERICAN: 55 mL/min — AB (ref >60)
GLUCOSE: 142 mg/dL — AB (ref 65–99)
Potassium: 4 mmol/L (ref 3.5–5.1)
Sodium: 136 mmol/L (ref 135–145)
Total Bilirubin: 2.7 mg/dL — ABNORMAL HIGH (ref 0.3–1.2)
Total Protein: 7.8 g/dL (ref 6.5–8.1)

## 2018-01-24 LAB — BLOOD GAS, ARTERIAL
Acid-base deficit: 5.7 mmol/L — ABNORMAL HIGH (ref 0.0–2.0)
Bicarbonate: 18.1 mmol/L — ABNORMAL LOW (ref 20.0–28.0)
Drawn by: 52076
O2 Content: 5 L/min
O2 SAT: 86.7 %
PO2 ART: 56.3 mmHg — AB (ref 83.0–108.0)
Patient temperature: 98.6
pCO2 arterial: 29.2 mmHg — ABNORMAL LOW (ref 32.0–48.0)
pH, Arterial: 7.41 (ref 7.350–7.450)

## 2018-01-24 LAB — TRIGLYCERIDES: TRIGLYCERIDES: 116 mg/dL (ref <150)

## 2018-01-24 LAB — POCT I-STAT 3, ART BLOOD GAS (G3+)
Acid-base deficit: 3 mmol/L — ABNORMAL HIGH (ref 0.0–2.0)
BICARBONATE: 22.4 mmol/L (ref 20.0–28.0)
O2 SAT: 100 %
TCO2: 24 mmol/L (ref 22–32)
pCO2 arterial: 40.8 mmHg (ref 32.0–48.0)
pH, Arterial: 7.347 — ABNORMAL LOW (ref 7.350–7.450)
pO2, Arterial: 318 mmHg — ABNORMAL HIGH (ref 83.0–108.0)

## 2018-01-24 LAB — URINALYSIS, ROUTINE W REFLEX MICROSCOPIC
Bilirubin Urine: NEGATIVE
Glucose, UA: NEGATIVE mg/dL
KETONES UR: NEGATIVE mg/dL
Leukocytes, UA: NEGATIVE
Nitrite: NEGATIVE
PROTEIN: NEGATIVE mg/dL
SQUAMOUS EPITHELIAL / LPF: NONE SEEN
Specific Gravity, Urine: 1.005 (ref 1.005–1.030)
pH: 5 (ref 5.0–8.0)

## 2018-01-24 LAB — PATHOLOGIST SMEAR REVIEW

## 2018-01-24 LAB — FIBRINOGEN: FIBRINOGEN: 617 mg/dL — AB (ref 210–475)

## 2018-01-24 LAB — LYMPHOCYTE SUBSETS, FLOW CYTOMETRY (INPT)

## 2018-01-24 LAB — PROTIME-INR
INR: 1.32
Prothrombin Time: 16.3 seconds — ABNORMAL HIGH (ref 11.4–15.2)

## 2018-01-24 LAB — FOLATE RBC
FOLATE, HEMOLYSATE: 189.5 ng/mL
FOLATE, RBC: 1805 ng/mL (ref >498)
Hematocrit: 10.5 % — CL (ref 37.5–51.0)

## 2018-01-24 LAB — PSA: PROSTATIC SPECIFIC ANTIGEN: 8.06 ng/mL — AB (ref 0.00–4.00)

## 2018-01-24 LAB — HAPTOGLOBIN: Haptoglobin: 180 mg/dL (ref 34–200)

## 2018-01-24 LAB — LACTIC ACID, PLASMA
Lactic Acid, Venous: 1.3 mmol/L (ref 0.5–1.9)
Lactic Acid, Venous: 1.3 mmol/L (ref 0.5–1.9)

## 2018-01-24 LAB — RETICULOCYTES
RBC.: 3.15 MIL/uL — ABNORMAL LOW (ref 4.22–5.81)
RETIC COUNT ABSOLUTE: 66.2 10*3/uL (ref 19.0–186.0)
Retic Ct Pct: 2.1 % (ref 0.4–3.1)

## 2018-01-24 LAB — LACTATE DEHYDROGENASE: LDH: 448 U/L — ABNORMAL HIGH (ref 98–192)

## 2018-01-24 LAB — PROCALCITONIN: Procalcitonin: 1.27 ng/mL

## 2018-01-24 LAB — APTT: aPTT: 36 seconds (ref 24–36)

## 2018-01-24 LAB — MRSA PCR SCREENING: MRSA by PCR: NEGATIVE

## 2018-01-24 MED ORDER — FENTANYL 2500MCG IN NS 250ML (10MCG/ML) PREMIX INFUSION
25.0000 ug/h | INTRAVENOUS | Status: DC
  Administered 2018-01-24: 100 ug/h via INTRAVENOUS
  Administered 2018-01-24: 50 ug/h via INTRAVENOUS
  Administered 2018-01-26: 100 ug/h via INTRAVENOUS
  Administered 2018-01-26: 150 ug/h via INTRAVENOUS
  Filled 2018-01-24 (×3): qty 250

## 2018-01-24 MED ORDER — MIDAZOLAM HCL 2 MG/2ML IJ SOLN
1.0000 mg | INTRAMUSCULAR | Status: AC | PRN
  Administered 2018-01-27 – 2018-01-29 (×3): 1 mg via INTRAVENOUS
  Filled 2018-01-24 (×2): qty 2

## 2018-01-24 MED ORDER — FENTANYL 2500MCG IN NS 250ML (10MCG/ML) PREMIX INFUSION
25.0000 ug/h | INTRAVENOUS | Status: DC
  Administered 2018-01-24: 50 ug/h via INTRAVENOUS
  Filled 2018-01-24: qty 250

## 2018-01-24 MED ORDER — FENTANYL CITRATE (PF) 100 MCG/2ML IJ SOLN: 50.0000 ug | Freq: Once | INTRAMUSCULAR | Status: AC

## 2018-01-24 MED ORDER — PROPOFOL 1000 MG/100ML IV EMUL
INTRAVENOUS | Status: AC
  Administered 2018-01-24: 20 ug/kg/min
  Filled 2018-01-24: qty 100

## 2018-01-24 MED ORDER — FUROSEMIDE 10 MG/ML IJ SOLN
INTRAMUSCULAR | Status: AC
  Filled 2018-01-24: qty 4

## 2018-01-24 MED ORDER — CHLORHEXIDINE GLUCONATE 0.12% ORAL RINSE (MEDLINE KIT)
15.0000 mL | Freq: Two times a day (BID) | OROMUCOSAL | Status: DC
  Administered 2018-01-24 – 2018-02-01 (×17): 15 mL via OROMUCOSAL

## 2018-01-24 MED ORDER — FENTANYL CITRATE (PF) 100 MCG/2ML IJ SOLN: 50.0000 ug | Freq: Once | INTRAMUSCULAR | Status: DC

## 2018-01-24 MED ORDER — FUROSEMIDE 10 MG/ML IJ SOLN
40.0000 mg | Freq: Once | INTRAMUSCULAR | Status: AC
  Administered 2018-01-24: 40 mg via INTRAVENOUS

## 2018-01-24 MED ORDER — SODIUM CHLORIDE 0.9 % IV SOLN
Freq: Once | INTRAVENOUS | Status: AC
  Administered 2018-01-24: 23:00:00 via INTRAVENOUS

## 2018-01-24 MED ORDER — FENTANYL CITRATE (PF) 100 MCG/2ML IJ SOLN
INTRAMUSCULAR | Status: AC
  Administered 2018-01-24: 100 ug
  Filled 2018-01-24: qty 2

## 2018-01-24 MED ORDER — INSULIN ASPART 100 UNIT/ML ~~LOC~~ SOLN
0.0000 [IU] | SUBCUTANEOUS | Status: DC
  Administered 2018-01-26 – 2018-01-28 (×6): 1 [IU] via SUBCUTANEOUS
  Administered 2018-01-28 (×2): 2 [IU] via SUBCUTANEOUS
  Administered 2018-01-28 – 2018-01-30 (×8): 1 [IU] via SUBCUTANEOUS
  Administered 2018-01-30: 2 [IU] via SUBCUTANEOUS
  Administered 2018-01-30 – 2018-02-01 (×7): 1 [IU] via SUBCUTANEOUS

## 2018-01-24 MED ORDER — IPRATROPIUM-ALBUTEROL 0.5-2.5 (3) MG/3ML IN SOLN
3.0000 mL | RESPIRATORY_TRACT | Status: DC | PRN
  Administered 2018-01-30: 3 mL via RESPIRATORY_TRACT

## 2018-01-24 MED ORDER — CEFEPIME HCL 2 G IJ SOLR
2.0000 g | Freq: Once | INTRAMUSCULAR | Status: AC
  Administered 2018-01-24: 2 g via INTRAVENOUS
  Filled 2018-01-24: qty 2

## 2018-01-24 MED ORDER — SODIUM CHLORIDE 0.9 % IV SOLN
2.0000 g | Freq: Two times a day (BID) | INTRAVENOUS | Status: DC
  Administered 2018-01-24 – 2018-01-25 (×2): 2 g via INTRAVENOUS
  Filled 2018-01-24 (×3): qty 2

## 2018-01-24 MED ORDER — FENTANYL BOLUS VIA INFUSION
50.0000 ug | INTRAVENOUS | Status: DC | PRN
  Administered 2018-01-24 – 2018-01-27 (×4): 50 ug via INTRAVENOUS
  Filled 2018-01-24: qty 50

## 2018-01-24 MED ORDER — MIDAZOLAM HCL 2 MG/2ML IJ SOLN
INTRAMUSCULAR | Status: AC
  Administered 2018-01-24: 4 mg
  Filled 2018-01-24: qty 2

## 2018-01-24 MED ORDER — IPRATROPIUM-ALBUTEROL 0.5-2.5 (3) MG/3ML IN SOLN
3.0000 mL | RESPIRATORY_TRACT | Status: DC
  Administered 2018-01-24 (×4): 3 mL via RESPIRATORY_TRACT
  Filled 2018-01-24 (×3): qty 3

## 2018-01-24 MED ORDER — MIDAZOLAM HCL 2 MG/2ML IJ SOLN
1.0000 mg | INTRAMUSCULAR | Status: DC | PRN
  Administered 2018-01-27 – 2018-01-28 (×6): 1 mg via INTRAVENOUS
  Filled 2018-01-24 (×7): qty 2

## 2018-01-24 MED ORDER — ORAL CARE MOUTH RINSE
15.0000 mL | OROMUCOSAL | Status: DC
  Administered 2018-01-24 – 2018-02-01 (×81): 15 mL via OROMUCOSAL

## 2018-01-24 MED ORDER — FENTANYL BOLUS VIA INFUSION
25.0000 ug | INTRAVENOUS | Status: DC | PRN
  Administered 2018-01-24: 50 ug via INTRAVENOUS
  Filled 2018-01-24: qty 25

## 2018-01-24 MED ORDER — MIDAZOLAM HCL 2 MG/2ML IJ SOLN
INTRAMUSCULAR | Status: AC
  Filled 2018-01-24: qty 2

## 2018-01-24 MED ORDER — PROPOFOL 1000 MG/100ML IV EMUL
0.0000 ug/kg/min | INTRAVENOUS | Status: DC
  Administered 2018-01-24 (×2): 30 ug/kg/min via INTRAVENOUS
  Administered 2018-01-24: 10 ug/kg/min via INTRAVENOUS
  Administered 2018-01-25: 30 ug/kg/min via INTRAVENOUS
  Administered 2018-01-26 – 2018-01-27 (×3): 10 ug/kg/min via INTRAVENOUS
  Filled 2018-01-24 (×5): qty 100

## 2018-01-24 NOTE — Progress Notes (Signed)
Oncology consult received Patient will be seen this AM  Sullivan Lone MD MS

## 2018-01-24 NOTE — Procedures (Signed)
Intubation Procedure Note Tommy Bentley [id number] [birth year]  Procedure: Intubation Indications: Respiratory insufficiency  Procedure Details Consent: Unable to obtain consent because of emergent medical necessity. Time Out: Verified patient identification, verified procedure, site/side was marked, verified correct patient position, special equipment/implants available, medications/allergies/relevent history reviewed, required imaging and test results available.  Performed  Maximum sterile technique was used including antiseptics, cap, gloves, gown, hand hygiene, mask and sheet.  MAC and 3   Administered: 4 mg Versed and Fentanyl 140m  Evaluation Hemodynamic Status: BP stable throughout; O2 sats: stable throughout Patient's Current Condition: stable Complications: No apparent complications Patient did tolerate procedure well. Chest X-ray ordered to verify placement.  CXR: pending.   KRise PaganiniScatliffe 01/24/2018

## 2018-01-24 NOTE — Procedures (Signed)
Bronchoscopy Procedure Note Zackaria Bill [id number] 1951/04/13  Procedure: Bronchoscopy Indications: Diagnostic evaluation of the airways, Obtain specimens for culture and/or other diagnostic studies and Remove secretions  Procedure Details Consent: Unable to obtain consent because of emergent medical necessity. Time Out: Verified patient identification, verified procedure, site/side was marked, verified correct patient position, special equipment/implants available, medications/allergies/relevent history reviewed, required imaging and test results available.  Performed  In preparation for procedure, patient was given 100% FiO2 and bronchoscope lubricated. Sedation: Benzodiazepines and fentanyl  Airway entered and the following bronchi were examined: RUL RML RLL, LUL LLL Copious sanguinous secretions noted diffusely, suctioned and noted to repool. Based on CXR Bronchoscope was wedged into RML and Serial aliquots of 60cc saline introduced.  Frothy sanguinous secretions suctioned - acceptable return of aliqout, did not clear. This was repeated once more with no clearing noted in aliquot however after second aliquot and BAL collected, no repooling was noted. Airways patent. bronchial walls have petechiae no signs of edema    Procedures performed: BAL. Serial aliquot. Bronchoscope removed. Patient placed back on 100% FiO2 at conclusion of procedure.    Evaluation Hemodynamic Status: BP stable throughout; O2 sats: stable throughout Patient's Current Condition: stable Specimens:  Sent serosanguinous fluid Complications: No apparent complications Patient did tolerate procedure well. CXR reviewed post procedure  will move ETT back to acceptable position. Communicated to RT.   Rise Paganini Anni Hocevar 01/24/2018

## 2018-01-24 NOTE — Progress Notes (Signed)
Pt seen by the critical care team and ordered to be transferred to the ICU to be intubated, pt transferred to 2M11 accompanied by the rapid respond RN Puja at 0250, report given at bedside to [facility], family also called and notified of transfer. Obasogie-Asidi, Sahalie Beth Efe

## 2018-01-24 NOTE — Progress Notes (Signed)
eLink Physician-Brief Progress Note Patient Name: Yariel Ferraris DOB: 1951/08/30 MRN: [id number]   Date of Service  01/24/2018  HPI/Events of Note  67 yr old [place] male presented with anemia and thrombocytopenia per residents smear evaluated showed myeloblasts ? AML Oncology was not prev consulted. Increased work of breathing desaturating, intractable cough with blood tinged sputum- intubated lots of bloody frothy secretions on intubation. Acute hypoxic resp failure secondary to DDx DAH vs Leukostasis. PCCM consulted. Bronchoscopy planned. Now intubated and ventilated. VSS.   eICU Interventions  No new orders      Intervention Category Evaluation Type: New Patient Evaluation  Sommer,Steven Eugene 01/24/2018, 3:21 AM

## 2018-01-24 NOTE — Progress Notes (Signed)
Pharmacy Antibiotic Note  Tommy Bentley is a 67 y.o. male admitted on 02/12/2018 with acute encephalopathy 3 weeks after diagnosis of influenza, now tx'd to MICU for hypoxia, to start ABX given immunocompromised state and functional neutropenia.  Pharmacy has been consulted for cefepime dosing.  Plan: Cefepime 2g IV Q12H.  Height: 5\' 3"  (160 cm) Weight: 130 lb (59 kg) IBW/kg (Calculated) : 56.9  Temp (24hrs), Avg:98.1 F (36.7 C), Min:97.5 F (36.4 C), Max:98.6 F (37 C)  Recent Labs  Lab 01/18/2018 1719 01/23/18 0205 01/23/18 1000 01/24/18 0315  WBC 12.6*  --   --  14.1*  CREATININE 1.48* 1.49* 1.39* 1.31*    Estimated Creatinine Clearance: 44.6 mL/min (A) (by C-G formula based on SCr of 1.31 mg/dL (H)).    No Known Allergies   Thank you for allowing pharmacy to be a part of this patient's care.  Wynona Neat, PharmD, BCPS  01/24/2018 4:22 AM

## 2018-01-24 NOTE — Consult Note (Addendum)
Marland Kitchen    HEMATOLOGY/ONCOLOGY CONSULTATION NOTE  Date of Service: 01/24/2018  Patient Care Team: Patient, No Pcp Per as PCP - General (General Practice)  CHIEF COMPLAINTS/PURPOSE OF CONSULTATION:  Severe Anemia + Thrombocytopenia Lymphocytosis  HISTORY OF PRESENTING ILLNESS:   Carolyn Dollins is a wonderful 67 y.o. male who has been referred to [place] by Dr Chase Caller, Belva Crome, MD  for evaluation and management of severe anemia and thrombocytopenia and newly noted lymphocytosis.  Patient is a 67 year old [place] male who is currently in the ICU and is intubated on a ventilator and unable to provide any history.  No family is at bedside. Information primarily obtained from his epic medical record. He has had very poor medical follow-up and has no real known medical problems and no previous available baseline labs.  He presented to the emergency room with overall fatigue and weakness for 2-3 weeks and right-sided focal neurological deficits and aphasia for the last 3-4 days prior to hospitalization.  He was apparently seen 3 weeks ago with upper respiratory infection and diagnosed with the flu and completed 5 days of Tamiflu.  His respiratory symptoms had apparently improved.  There is also report that about 2 days prior to hospitalization he was noted to have dark black melanotic stools as well as bright red blood per rectum.  Labs on hospitalization showed a hemoglobin of 3.5 with an MCV of 78.5 and RDW of 36.  Platelet counts of 43k and a WBC count of 12.6k with absolute lymphocytosis of 9.9k with neutropenia of 1.4k and increased nucleated red blood cells in the peripheral blood. Patient has received 5 units of PRBCs and hemoglobin is now up to 10 platelet counts have improved to 60k.  His LDH levels have been elevated at 448 with normal haptoglobin of 180 suggesting against overt intravascular hemolysis.  Ferritin levels were elevated at 724 with iron saturation of 26% .  B12 levels 783.  CT head  showed vAreas of low-density in the right occipital lobe and within the left hemispheric white matter worrisome for acute/subacute infarctions. No hemorrhage or mass effect. The infarctions could be embolic or watershed.  MRI/MRA brain on 01/27/2018 showed Numerous small foci of acute/early subacute infarction are present throughout the left hemisphere, largely in a watershed distribution. Late subacute to chronic hemorrhagic infarct in right occipital lobe. MRA neck and head were done.  Patient was also thought to have a pneumonia and is currently on broad-spectrum antibiotics.  Flow cytometry on peripheral blood has been sent and results are currently pending. I reviewed the patient's peripheral blood smear which  showed blastoid appearing cells and atypical lymphocytes.  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  No previous baseline labs available.  Increased nucleated PRBC could be a function of chronic hemolysis or leuko-erythroblastic reaction from infiltrative bone marrow process.  MEDICAL HISTORY:  Past Medical History:  Diagnosis Date  . Hard of hearing     SURGICAL HISTORY: History reviewed. No pertinent surgical history.  SOCIAL HISTORY: Social History   Socioeconomic History  . Marital status: Married    Spouse name: Not on file  . Number of children: Not on file  . Years of education: Not on file  . Highest education level: Not on file  Occupational History  . Not on file  Social Needs  . Financial resource strain: Not on file  . Food insecurity:    Worry: Not on file    Inability: Not on file  .  Transportation needs:    Medical: Not on file    Non-medical: Not on file  Tobacco Use  . Smoking status: Current Every Day Smoker    Packs/day: 0.33  . Smokeless tobacco: Never Used  Substance and Sexual Activity  . Alcohol use: Yes    Comment: Occasional  . Drug use: No  . Sexual activity: Not  on file  Lifestyle  . Physical activity:    Days per week: Not on file    Minutes per session: Not on file  . Stress: Not on file  Relationships  . Social connections:    Talks on phone: Not on file    Gets together: Not on file    Attends religious service: Not on file    Active member of club or organization: Not on file    Attends meetings of clubs or organizations: Not on file    Relationship status: Not on file  . Intimate partner violence:    Fear of current or ex partner: Not on file    Emotionally abused: Not on file    Physically abused: Not on file    Forced sexual activity: Not on file  Other Topics Concern  . Not on file  Social History Narrative  . Not on file    FAMILY HISTORY: No family history on file.  ALLERGIES:  has No Known Allergies.  MEDICATIONS:  Current Facility-Administered Medications  Medication Dose Route Frequency Provider Last Rate Last Dose  . acetaminophen (TYLENOL) tablet 650 mg  650 mg Oral Q6H PRN Lorella Nimrod, MD       Or  . acetaminophen (TYLENOL) suppository 650 mg  650 mg Rectal Q6H PRN Lorella Nimrod, MD      . ceFEPIme (MAXIPIME) 2 g in sodium chloride 0.9 % 100 mL IVPB  2 g Intravenous Q12H Laren Everts, RPH      . chlorhexidine gluconate (MEDLINE KIT) (PERIDEX) 0.12 % solution 15 mL  15 mL Mouth Rinse BID Brand Males, MD   15 mL at 01/24/18 0753  . fentaNYL (SUBLIMAZE) bolus via infusion 50 mcg  50 mcg Intravenous Q1H PRN Scatliffe, Kristen D, MD      . fentaNYL 2526mg in NS 2560m(1039mml) infusion-PREMIX  25-400 mcg/hr Intravenous Continuous Scatliffe, Kristen D, MD 10 mL/hr at 01/24/18 0600 100 mcg/hr at 01/24/18 0600  . folic acid injection 1 mg  1 mg Intravenous Daily Pisciotta, Nicole, PA-C   1 mg at 01/23/18 1137  . ipratropium-albuterol (DUONEB) 0.5-2.5 (3) MG/3ML nebulizer solution 3 mL  3 mL Nebulization Q4H AmiLorella NimrodD   3 mL at 01/24/18 0332  . MEDLINE mouth rinse  15 mL Mouth Rinse 10 times per day  RamBrand MalesD   15 mL at 01/24/18 0518  . midazolam (VERSED) injection 1 mg  1 mg Intravenous Q15 min PRN Desai, Rahul P, PA-C      . midazolam (VERSED) injection 1 mg  1 mg Intravenous Q2H PRN Desai, Rahul P, PA-C      . octreotide (SANDOSTATIN) 500 mcg in sodium chloride 0.9 % 250 mL (2 mcg/mL) infusion  50 mcg/hr Intravenous Continuous Pisciotta, Nicole, PA-C 25 mL/hr at 01/24/18 0600 50 mcg/hr at 01/24/18 0600  . ondansetron (ZOFRAN) tablet 4 mg  4 mg Oral Q6H PRN AmiLorella NimrodD       Or  . ondansetron (ZOFRAN) injection 4 mg  4 mg Intravenous Q6H PRN AmiLorella NimrodD      . pantoprazole (PROWaverly0  mg in sodium chloride 0.9 % 250 mL (0.32 mg/mL) infusion  8 mg/hr Intravenous Continuous Pisciotta, Nicole, PA-C 25 mL/hr at 01/24/18 0717 8 mg/hr at 01/24/18 0717  . propofol (DIPRIVAN) 1000 MG/100ML infusion  0-50 mcg/kg/min Intravenous Continuous Scatliffe, Kristen D, MD 3.5 mL/hr at 01/24/18 0515 10 mcg/kg/min at 01/24/18 0515  . senna-docusate (Senokot-S) tablet 1 tablet  1 tablet Oral QHS PRN Lorella Nimrod, MD      . sodium chloride flush (NS) 0.9 % injection 3 mL  3 mL Intravenous Q12H Lorella Nimrod, MD   3 mL at 01/23/18 2300  . thiamine (B-1) injection 100 mg  100 mg Intravenous Daily Lorella Nimrod, MD   100 mg at 01/23/18 1137    REVIEW OF SYSTEMS:    10 Point review of Systems was done is negative except as noted above.  PHYSICAL EXAMINATION: ECOG PERFORMANCE STATUS: 4 - Bedbound  . Vitals:   01/24/18 0855 01/24/18 0919  BP:  (!) 90/50  Pulse:  72  Resp:  20  Temp:    SpO2: 100% 100%   Filed Weights   02/13/2018 1513  Weight: 130 lb (59 kg)   .Body mass index is 23.03 kg/m.  Val[place] male , sedated, intubated on ventilator. SKIN: no acute rashes noted EYES: conjunctival pallor noted. OROPHARYNX: ETT in situ NECK: ETT in situ, no overt JVD noted LYMPH:  no palpable lymphadenopathy in the cervical, axillary regions LUNGS: coarse breath sounds  b/l HEART: regular rate & rhythm ABDOMEN:  normoactive bowel sounds , non tender, not distended. Extremity: no pedal edema NEURO: sedated intubated.  LABORATORY DATA:  I have reviewed the data as listed  . CBC Latest Ref Rng & Units 01/24/2018 01/23/2018 01/23/2018  WBC 4.0 - 10.5 K/uL 14.1(H) - -  Hemoglobin 13.0 - 17.0 g/dL 10.0(L) 10.0(L) 6.9(LL)  Hematocrit 39.0 - 52.0 % 30.9(L) 30.9(L) 21.7(L)  Platelets 150 - 400 K/uL 60(L) - -    CMP Latest Ref Rng & Units 01/24/2018 01/23/2018 01/23/2018  Glucose 65 - 99 mg/dL 142(H) 120(H) 142(H)  BUN 6 - 20 mg/dL 27(H) 30(H) 30(H)  Creatinine 0.61 - 1.24 mg/dL 1.31(H) 1.39(H) 1.49(H)  Sodium 135 - 145 mmol/L 136 134(L) 131(L)  Potassium 3.5 - 5.1 mmol/L 4.0 4.2 4.1  Chloride 101 - 111 mmol/L 109 104 101  CO2 22 - 32 mmol/L 17(L) 18(L) 17(L)  Calcium 8.9 - 10.3 mg/dL 7.7(L) 7.7(L) 7.7(L)  Total Protein 6.5 - 8.1 g/dL 7.8 - 7.5  Total Bilirubin 0.3 - 1.2 mg/dL 2.7(H) - 1.5(H)  Alkaline Phos 38 - 126 U/L 58 - 52  AST 15 - 41 U/L 28 - 21  ALT 17 - 63 U/L 11(L) - 10(L)   Component     Latest Ref Rng & Units 02/06/2018 01/23/2018 01/24/2018  Neutrophils     % 11    Lymphocytes     % 79    Monocytes Relative     % 10    Eosinophil     % 0    Basophil     % 0    nRBC     0 /100 WBC 10 (H)    NEUT#     1.7 - 7.7 K/uL 1.4 (L)    Lymphocyte #     0.7 - 4.0 K/uL 9.9 (H)    Monocyte #     0.1 - 1.0 K/uL 1.3 (H)    Eosinophils Absolute     0.0 - 0.7 K/uL 0.0  Basophils Absolute     0.0 - 0.1 K/uL 0.0    RBC Morphology      MIXED RBC POPULATION    WBC Morphology      ATYPICAL LYMPHOCYTES    Smear Review      PENDING PATHOLOGIST REVIEW    WBC     4.0 - 10.5 K/uL 12.6 (H)    RBC     4.22 - 5.81 MIL/uL 1.58 (L)    Hemoglobin     13.0 - 17.0 g/dL 3.5 (LL)    HCT     39.0 - 52.0 % 12.4 (L)    MCV     78.0 - 100.0 fL 78.5    MCH     26.0 - 34.0 pg 22.2 (L)    MCHC     30.0 - 36.0 g/dL 28.2 (L)    RDW     11.5 - 15.5 % 36.0 (H)     Platelets     150 - 400 K/uL 43 (L)    Iron     45 - 182 ug/dL  60   TIBC     250 - 450 ug/dL  231 (L)   Saturation Ratios     17.9 - 39.5 %  26   UIBC     ug/dL  171   Retic Ct Pct     0.4 - 3.1 %  2.7   RBC.     4.22 - 5.81 MIL/uL  2.72 (L)   Retic Count, Absolute     19.0 - 186.0 K/uL  73.4   Prothrombin Time     11.4 - 15.2 seconds   16.3 (H)  INR        1.32  Ferritin     24 - 336 ng/mL  724 (H)   Vitamin B12     180 - 914 pg/mL  783   LDH     98 - 192 U/L  376 (H) 448 (H)  Haptoglobin     34 - 200 mg/dL  180   Uric Acid, Serum     4.4 - 7.6 mg/dL  8.6 (H)   Fibrinogen     210 - 475 mg/dL   617 (H)  APTT     24 - 36 seconds   36    RADIOGRAPHIC STUDIES: I have personally reviewed the radiological images as listed and agreed with the findings in the report. Ct Head Wo Contrast  Result Date: 01/28/2018 CLINICAL DATA:  Mental status changes with loss of appetite and gait disturbance. Right-sided facial droop. Incontinence. EXAM: CT HEAD WITHOUT CONTRAST TECHNIQUE: Contiguous axial images were obtained from the base of the skull through the vertex without intravenous contrast. COMPARISON:  None. FINDINGS: Brain: There chronic small-vessel changes of the pons. No focal cerebellar finding. Cerebral hemispheres show focal low density in the right occipital lobe and within scattered areas of the deep white matter of the left hemisphere. The findings are worrisome for subacute infarctions. These could be embolic or watershed. No evidence of mass lesion, hemorrhage, hydrocephalus or extra-axial collection. Vascular: There is atherosclerotic calcification of the major vessels at the base of the brain. Skull: Negative Sinuses/Orbits: Clear/normal Other: None IMPRESSION: Areas of low-density in the right occipital lobe and within the left hemispheric white matter worrisome for acute/subacute infarctions. No hemorrhage or mass effect. The infarctions could be embolic or watershed.  Electronically Signed   By: Nelson Chimes M.D.   On: 02/07/2018 16:08   Mr  Mra Head Wo Contrast  Result Date: 01/23/2018 CLINICAL DATA:  68 y/o M; per family, patient not walking very well for a couple of weeks and 3 days ago stops talking and has not moving the right side. EXAM: MRI HEAD WITHOUT CONTRAST MRA HEAD WITHOUT CONTRAST MRA NECK WITHOUT CONTRAST TECHNIQUE: Multiplanar, multiecho pulse sequences of the brain and surrounding structures were obtained without intravenous contrast. Angiographic images of the Circle of Willis were obtained using MRA technique without intravenous contrast. Angiographic images of the neck were obtained using MRA technique without intravenous contrast. Carotid stenosis measurements (when applicable) are obtained utilizing NASCET criteria, using the distal internal carotid diameter as the denominator. COMPARISON:  01/26/2018 CT head. FINDINGS: MRI HEAD FINDINGS Brain: There are numerous foci of reduced diffusion throughout left frontal and parietal white matter, left posterior temporal cortex, as well as punctate foci scattered throughout the left frontal, parietal, and temporal cortices compatible with acute/early subacute infarction. No associated hemorrhage or mass effect. There are a few foci of diffusion hyperintensity with intermediate diffusion in the right occipital lobe likely a late subacute to chronic infarction. There is curvilinear susceptibility hypointensity overlying the right occipital lobe compatible with hemosiderin deposition. Background of moderate chronic microvascular ischemic changes and parenchymal volume loss of the brain. No hydrocephalus, acute extra-axial collection, or effacement of basilar cisterns. Vascular: As below. Skull and upper cervical spine: Normal marrow signal. Sinuses/Orbits: Moderate left maxillary sinus mucosal thickening with a small fluid level. Otherwise negative. Other: None. MRA HEAD FINDINGS Internal carotid arteries: Patent.  Lumen irregularity of the right carotid siphon with multiple segments of mild stenosis. Anterior cerebral arteries:  Patent. Middle cerebral arteries: Patent right MCA distribution. The left M2 superior division appears to have a common origin with the left A1 and is occluded proximally (series 453, image 12). The left M2 inferior division is patent. Anterior communicating artery: Patent. Posterior communicating arteries:  Patent. Posterior cerebral arteries:  Patent. Basilar artery: Patent. Left AICA origin moderate to severe stenosis. Vertebral arteries:  Patent. No evidence of high-grade stenosis, large vessel occlusion, or aneurysm unless noted above. MRA NECK FINDINGS Severe motion artifact. No definite evidence for hemodynamically significant stenosis by NASCET criteria. Aortic arch: Patent. Right common carotid artery: Patent. Right internal carotid artery: Patent. Right vertebral artery: Patent. Left common carotid artery: Patent. Left Internal carotid artery: Patent. Left Vertebral artery: Patent. IMPRESSION: MRI head: 1. Numerous small foci of acute/early subacute infarction are present throughout the left hemisphere, largely in a watershed distribution. 2. Late subacute to chronic hemorrhagic infarct in right occipital lobe. 3. No acute hemorrhage, mass effect, or herniation. 4. Background of moderate chronic microvascular ischemic changes and parenchymal volume loss of the brain. MRA head: 1. Left M2 superior division appears to have common origin with left A1 and is occluded proximally. 2. No additional large vessel occlusion of the intracranial circulation identified. MRA neck: Severe motion artifact. Patent carotid systems and vertebral arteries. No definite evidence for hemodynamically significant stenosis by NASCET criteria. These results were called by telephone at the time of interpretation on 01/23/2018 at 12:38 am to Dr. Roland Rack , who verbally acknowledged these results. Electronically  Signed   By: Kristine Garbe M.D.   On: 01/23/2018 00:42   [name] Neck Wo Contrast  Result Date: 01/23/2018 CLINICAL DATA:  67 y/o M; per family, patient not walking very well for a couple of weeks and 3 days ago stops talking and has not moving the right side. EXAM: MRI HEAD  WITHOUT CONTRAST MRA HEAD WITHOUT CONTRAST MRA NECK WITHOUT CONTRAST TECHNIQUE: Multiplanar, multiecho pulse sequences of the brain and surrounding structures were obtained without intravenous contrast. Angiographic images of the Circle of Willis were obtained using MRA technique without intravenous contrast. Angiographic images of the neck were obtained using MRA technique without intravenous contrast. Carotid stenosis measurements (when applicable) are obtained utilizing NASCET criteria, using the distal internal carotid diameter as the denominator. COMPARISON:  02/06/2018 CT head. FINDINGS: MRI HEAD FINDINGS Brain: There are numerous foci of reduced diffusion throughout left frontal and parietal white matter, left posterior temporal cortex, as well as punctate foci scattered throughout the left frontal, parietal, and temporal cortices compatible with acute/early subacute infarction. No associated hemorrhage or mass effect. There are a few foci of diffusion hyperintensity with intermediate diffusion in the right occipital lobe likely a late subacute to chronic infarction. There is curvilinear susceptibility hypointensity overlying the right occipital lobe compatible with hemosiderin deposition. Background of moderate chronic microvascular ischemic changes and parenchymal volume loss of the brain. No hydrocephalus, acute extra-axial collection, or effacement of basilar cisterns. Vascular: As below. Skull and upper cervical spine: Normal marrow signal. Sinuses/Orbits: Moderate left maxillary sinus mucosal thickening with a small fluid level. Otherwise negative. Other: None. MRA HEAD FINDINGS Internal carotid arteries: Patent. Lumen  irregularity of the right carotid siphon with multiple segments of mild stenosis. Anterior cerebral arteries:  Patent. Middle cerebral arteries: Patent right MCA distribution. The left M2 superior division appears to have a common origin with the left A1 and is occluded proximally (series 453, image 12). The left M2 inferior division is patent. Anterior communicating artery: Patent. Posterior communicating arteries:  Patent. Posterior cerebral arteries:  Patent. Basilar artery: Patent. Left AICA origin moderate to severe stenosis. Vertebral arteries:  Patent. No evidence of high-grade stenosis, large vessel occlusion, or aneurysm unless noted above. MRA NECK FINDINGS Severe motion artifact. No definite evidence for hemodynamically significant stenosis by NASCET criteria. Aortic arch: Patent. Right common carotid artery: Patent. Right internal carotid artery: Patent. Right vertebral artery: Patent. Left common carotid artery: Patent. Left Internal carotid artery: Patent. Left Vertebral artery: Patent. IMPRESSION: MRI head: 1. Numerous small foci of acute/early subacute infarction are present throughout the left hemisphere, largely in a watershed distribution. 2. Late subacute to chronic hemorrhagic infarct in right occipital lobe. 3. No acute hemorrhage, mass effect, or herniation. 4. Background of moderate chronic microvascular ischemic changes and parenchymal volume loss of the brain. MRA head: 1. Left M2 superior division appears to have common origin with left A1 and is occluded proximally. 2. No additional large vessel occlusion of the intracranial circulation identified. MRA neck: Severe motion artifact. Patent carotid systems and vertebral arteries. No definite evidence for hemodynamically significant stenosis by NASCET criteria. These results were called by telephone at the time of interpretation on 01/23/2018 at 12:38 am to Dr. Roland Rack , who verbally acknowledged these results. Electronically  Signed   By: Kristine Garbe M.D.   On: 01/23/2018 00:42   Mr Brain Wo Contrast  Result Date: 01/23/2018 CLINICAL DATA:  67 y/o M; per family, patient not walking very well for a couple of weeks and 3 days ago stops talking and has not moving the right side. EXAM: MRI HEAD WITHOUT CONTRAST MRA HEAD WITHOUT CONTRAST MRA NECK WITHOUT CONTRAST TECHNIQUE: Multiplanar, multiecho pulse sequences of the brain and surrounding structures were obtained without intravenous contrast. Angiographic images of the Circle of Willis were obtained using MRA technique without intravenous contrast. Angiographic images  of the neck were obtained using MRA technique without intravenous contrast. Carotid stenosis measurements (when applicable) are obtained utilizing NASCET criteria, using the distal internal carotid diameter as the denominator. COMPARISON:  01/27/2018 CT head. FINDINGS: MRI HEAD FINDINGS Brain: There are numerous foci of reduced diffusion throughout left frontal and parietal white matter, left posterior temporal cortex, as well as punctate foci scattered throughout the left frontal, parietal, and temporal cortices compatible with acute/early subacute infarction. No associated hemorrhage or mass effect. There are a few foci of diffusion hyperintensity with intermediate diffusion in the right occipital lobe likely a late subacute to chronic infarction. There is curvilinear susceptibility hypointensity overlying the right occipital lobe compatible with hemosiderin deposition. Background of moderate chronic microvascular ischemic changes and parenchymal volume loss of the brain. No hydrocephalus, acute extra-axial collection, or effacement of basilar cisterns. Vascular: As below. Skull and upper cervical spine: Normal marrow signal. Sinuses/Orbits: Moderate left maxillary sinus mucosal thickening with a small fluid level. Otherwise negative. Other: None. MRA HEAD FINDINGS Internal carotid arteries: Patent. Lumen  irregularity of the right carotid siphon with multiple segments of mild stenosis. Anterior cerebral arteries:  Patent. Middle cerebral arteries: Patent right MCA distribution. The left M2 superior division appears to have a common origin with the left A1 and is occluded proximally (series 453, image 12). The left M2 inferior division is patent. Anterior communicating artery: Patent. Posterior communicating arteries:  Patent. Posterior cerebral arteries:  Patent. Basilar artery: Patent. Left AICA origin moderate to severe stenosis. Vertebral arteries:  Patent. No evidence of high-grade stenosis, large vessel occlusion, or aneurysm unless noted above. MRA NECK FINDINGS Severe motion artifact. No definite evidence for hemodynamically significant stenosis by NASCET criteria. Aortic arch: Patent. Right common carotid artery: Patent. Right internal carotid artery: Patent. Right vertebral artery: Patent. Left common carotid artery: Patent. Left Internal carotid artery: Patent. Left Vertebral artery: Patent. IMPRESSION: MRI head: 1. Numerous small foci of acute/early subacute infarction are present throughout the left hemisphere, largely in a watershed distribution. 2. Late subacute to chronic hemorrhagic infarct in right occipital lobe. 3. No acute hemorrhage, mass effect, or herniation. 4. Background of moderate chronic microvascular ischemic changes and parenchymal volume loss of the brain. MRA head: 1. Left M2 superior division appears to have common origin with left A1 and is occluded proximally. 2. No additional large vessel occlusion of the intracranial circulation identified. MRA neck: Severe motion artifact. Patent carotid systems and vertebral arteries. No definite evidence for hemodynamically significant stenosis by NASCET criteria. These results were called by telephone at the time of interpretation on 01/23/2018 at 12:38 am to Dr. Roland Rack , who verbally acknowledged these results. Electronically  Signed   By: Kristine Garbe M.D.   On: 01/23/2018 00:42   Ct Abdomen Pelvis W Contrast  Result Date: 01/21/2018 CLINICAL DATA:  67 y/o M; patient stopped eating 3 days ago. Non communicative. Question GI bleed. EXAM: CT ABDOMEN AND PELVIS WITH CONTRAST TECHNIQUE: Multidetector CT imaging of the abdomen and pelvis was performed using the standard protocol following bolus administration of intravenous contrast. CONTRAST:  38m ISOVUE-300 IOPAMIDOL (ISOVUE-300) INJECTION 61% COMPARISON:  None. FINDINGS: Lower chest: Small foci of consolidation in the right lower and middle lobe. Calcified pleural plaque at the right dome of diaphragm. Hepatobiliary: Calcified subcentimeter granuloma in right lobe of liver. Otherwise no focal liver abnormality is seen. No gallstones, gallbladder wall thickening, or biliary dilatation. Pancreas: Unremarkable. No pancreatic ductal dilatation or surrounding inflammatory changes. Spleen: Normal in size without focal  abnormality. Adrenals/Urinary Tract: Adrenal glands are unremarkable. Kidneys are normal, without renal calculi, focal lesion, or hydronephrosis. Bladder is unremarkable. Stomach/Bowel: Stomach is within normal limits. Appendix appears normal. No evidence of bowel wall thickening, distention, or inflammatory changes. Vascular/Lymphatic: Severe calcific atherosclerosis of the abdominal aorta with bilateral common iliac stenosis. Reproductive: Moderate prostate enlargement. Other: No abdominal wall hernia or abnormality. No abdominopelvic ascites. Musculoskeletal: No fracture is seen. IMPRESSION: 1. Small foci of consolidation in the right middle and lower lobe may represent pneumonia. 2. Severe calcific atherosclerosis of abdominal aorta and iliofemoral arteries. 3. Moderate prostate enlargement. 4. No acute process of abdomen or pelvis identified. Electronically Signed   By: Kristine Garbe M.D.   On: 02/11/2018 22:10   Dg Chest Port 1 View  Result  Date: 01/24/2018 CLINICAL DATA:  67 y/o  M; central line placement. EXAM: PORTABLE CHEST 1 VIEW COMPARISON:  01/24/2018 chest radiograph. FINDINGS: Stable endotracheal tube tip 2.3 cm above carina. Stable cardiomegaly given projection and technique. Left central venous catheter tip projects over mid SVC. No pneumothorax. Stable hazy opacities of the lungs and ovoid opacity in right mid lung zone probably representing loculated effusion on the fissure. Probable small right effusion. Right apical pleuroparenchymal scarring. No acute osseous abnormality identified. IMPRESSION: Left central venous catheter tip projects over mid SVC. No pneumothorax. Stable hazy opacities of the lungs. Electronically Signed   By: Kristine Garbe M.D.   On: 01/24/2018 04:48   Portable Chest Xray  Result Date: 01/24/2018 CLINICAL DATA:  Respiratory failure.  Post intubation. EXAM: PORTABLE CHEST 1 VIEW COMPARISON:  Radiographs 2 hours prior. FINDINGS: Endotracheal tube is in place tip 2.3 cm from the carina. Unchanged heart size and mild cardiomegaly. Bilateral perihilar opacities with slight improvement from prior exam. Ovoid density persist in the right mid lung may be fluid in the fissure or focal pulmonary process. Right costophrenic angle excluded from the field of view. Biapical pleuroparenchymal scarring. No pneumothorax. IMPRESSION: 1. Endotracheal tube 2.3 cm from the carina. 2. Bilateral perihilar opacities with slight improvement from prior exam. 3. Unchanged ovoid density in the periphery of the right lung may be fluid in the fissure or focal pulmonary process such as pneumonia or pulmonary mass. Electronically Signed   By: Jeb Levering M.D.   On: 01/24/2018 04:19   Dg Chest Port 1 View  Result Date: 01/24/2018 CLINICAL DATA:  Hemoptysis.  Cough. EXAM: PORTABLE CHEST 1 VIEW COMPARISON:  None. FINDINGS: The heart is enlarged. Aortic atherosclerosis. Bilateral perihilar opacities favor pulmonary edema.  Peripheral 3.4 cm ovoid density in the right mid lung. Possible small right pleural effusion. Biapical pleuroparenchymal scarring. No pneumothorax. IMPRESSION: 1. Cardiomegaly with perihilar opacities, favoring pulmonary edema. Suspect CHF. 2. Ovoid 3.4 cm density in the periphery of the right mid lung may be loculated fluid in the fissure versus consolidation or pulmonary mass. Possible small right pleural effusion. 3. Recommend radiographic follow-up, should right midlung opacity persist, recommend chest CT. Electronically Signed   By: Jeb Levering M.D.   On: 01/24/2018 03:04    ASSESSMENT & PLAN:   67 yo [place] male with   1) Severe Anemia -microcytic. peripheral blood smear reviewed-show significant anisopoikilocytosis with nucleated red blood cells. Overall concerning for possible baseline hemoglobinopathy. Some increased schistocytes but with normal haptoglobin does not reflect overt intravascular hemolysis.  Also the patient does not seem to have appropriate reticulocytosis suggesting some element of marrow suppression. Ferritin levels elevated and do not suggest chronic GI bleeding but there is  clinically overt concerns for an acute GI bleed.  2) Thrombocytopenia  Likely multifactorial. Bone marrow infiltrative process plus pneumonia plus recent influenza plus possible medications. Platelets slightly better at 60k  3) leukoerythroblastic reaction.   Peripheral blood smear shows blastoid appearing cells cannot rule out an aggressive lymphoma  with leukemic component (Mantle cell lymphoma /Burkitts.  Mantle cell lymphoma also is known to have GI involvement and 40% of patients and this would be a possibility given his GI bleeding) Cannot rule out acute leukemia - AML vs Acute prolymphocytic leukemia.  4) Multiple embolic vs watershed infarcts  5) Pneumonia ?aspiration vs CAP  PLAN -Peripheral blood smear and labs reviewed in detail and patient examined in the ICU. -I reviewed the  case with our hematopathologist Dr. Gari Crown  and also to try to optimize and speed up the flow cytometric testing today.  Significant abnormal lymphocytes/blastoid cells in the peripheral blood -hopeful to have a definitive diagnosis by flow cytometry. -Patient might need CT-guided bone marrow examination by interventional radiology if stable and okay per family based on goals of care.  -Hemoglobinopathy evaluation requested. - no overt evidence of TTP/HUS spectrum disorder at this time in the absence of overt intravascular hemolysis with normal haptoglobin level and absence of reticulocytosis. -myeloma panel. -On antibiotics per critical care team for his pneumonia -Neurology following for management of his stroke. -Transfuse PRBCs as needed for hemoglobin less than 8 -Transfuse platelets as needed for platelets less than 50,000 if actively bleeding or prophylactically for platelets less than 20k due to risk of hemorrhagic transformation of his strokes and concerns for GI bleeding.  All of the patients questions were answered with apparent satisfaction. The patient knows to call the clinic with any problems, questions or concerns.  I spent 60 minutes counseling the patient face to face. The total time spent in the appointment was 80 minutes and more than 50% was on counseling and direct patient cares.    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 Plan -concern with CNS involvement with AML -will need goals of care discussion. Overall appears to not be a good candidate for aggressive 7+3 or 5+2 induction chemotherapy at this time. -if patients overall other issues are stable -could consider  transfer to [facility] for mx of AML (we do not pursue Acute leukemia induction treatments in the absence of a dedicated inpatient oncology service at [facility]). -A comfort care /hospice approach would be reasonable. -Option 3-- likely not possible is patient stabilizes and count relatively stable and can f/u as outpatient with our clinic to consider Azacytidine/Decitabine treatment. At this time performance status precludes this option.  Sullivan Lone MD MS

## 2018-01-24 NOTE — Progress Notes (Signed)
Pt observed to have coughed out bloody sputum, suctioned out small amount of bloody sputum and having rapid respiration with goggling sound, Doc on call paged and notified, came up to review pt, ordered a Stat PCXR, and iv Lasix 40mg  given at 0139, ABG also drawn and respiratory treatments ordered and commenced accordingly, will however continue to monitor. Obasogie-Asidi, Jenella Craigie Efe

## 2018-01-24 NOTE — Progress Notes (Addendum)
PULMONARY / CRITICAL CARE MEDICINE   Name: Tommy Bentley MRN: [id number] DOB: 18-Sep-1951    ADMISSION DATE:  02/04/2018  REFERRING MD:  Dr. Shan Levans ([facility])  CHIEF COMPLAINT:    HISTORY OF PRESENT ILLNESS:   Tommy Bentley is a 67 y.o [place] male with history of influenza 3 weeks ago who presented with 2 week history of weakness and 3 day history of aphasia and right sided weakness. Patient presented with bilateral hemorrhagic strokes. Labs were remarkable for Hgb 3.9 and Plts 40 and he received 5 u PRBCs. Smear showed concern for a new diagnosis of AML.   On the evening of 01/24/18 the patient became hypoxic and had an increased work of breathing. Chest xray showed bilateral infiltrates. She also had some blood tinged sputum noted on admission.   The patient was intubated for management of vent and due to concern for TRALI vs pulmonary hemorrhage vc noncardiogenic pulmonary edema, vs dilutionary coagulopathy. GI was consulted for EGD on 4/9.  PAST MEDICAL HISTORY :  He  has a past medical history of Hard of hearing.  PAST SURGICAL HISTORY: He  has no past surgical history on file.  No Known Allergies  No current facility-administered medications on file prior to encounter.    Current Outpatient Medications on File Prior to Encounter  Medication Sig  . diclofenac (VOLTAREN) 50 MG EC tablet Take 1 tablet (50 mg total) by mouth 3 (three) times daily as needed for moderate pain. (Patient not taking: Reported on 02/10/2018)  . Diclofenac Sodium & Capsaicin 1.5 & 0.025 % THPK Apply 1 application topically 2 (two) times daily. (Patient not taking: Reported on 01/17/2018)  . gabapentin (NEURONTIN) 300 MG capsule Take 1 capsule (300 mg total) by mouth at bedtime. (Patient not taking: Reported on 01/21/2018)  . predniSONE (DELTASONE) 50 MG tablet Take 1 pill daily for 5 days. (Patient not taking: Reported on 01/23/2018)    FAMILY HISTORY:  His has no family status information on file.    SOCIAL HISTORY: He   reports that he has been smoking.  He has been smoking about 0.33 packs per day. He has never used smokeless tobacco. He reports that he drinks alcohol. He reports that he does not use drugs.  SUBJECTIVE:  Patient is sedated.  VITAL SIGNS: BP (!) 101/59   Pulse 82   Temp 98.4 F (36.9 C) (Oral)   Resp 12   Ht '5\' 3"'  (1.6 m)   Wt 130 lb (59 kg)   SpO2 100%   BMI 23.03 kg/m   VENTILATOR SETTINGS: Vent Mode: PRVC FiO2 (%):  [80 %-100 %] 80 % Set Rate:  [16 bmp] 16 bmp Vt Set:  [340 mL] 340 mL PEEP:  [5 cmH20] 5 cmH20 Plateau Pressure:  [12 cmH20] 12 cmH20  INTAKE / OUTPUT: I/O last 3 completed shifts: In: 4921.8 [I.V.:2964.6; Blood:1507.3; IV Piggyback:450] Out: -   PHYSICAL EXAMINATION: Physical Exam  HENT:  Head: Normocephalic and atraumatic.  Cardiovascular: Normal rate, regular rhythm and normal heart sounds.  Respiratory: Effort normal and breath sounds normal. No respiratory distress. He has no wheezes.  GI: Soft. Bowel sounds are normal. He exhibits no distension. There is no tenderness.  Musculoskeletal: He exhibits no edema.  Neurological:  sedated  Skin: No erythema.    LABS:  BMET Recent Labs  Lab 01/23/18 0205 01/23/18 1000 01/24/18 0315  NA 131* 134* 136  K 4.1 4.2 4.0  CL 101 104 109  CO2 17* 18* 17*  BUN 30*  30* 27*  CREATININE 1.49* 1.39* 1.31*  GLUCOSE 142* 120* 142*    Electrolytes Recent Labs  Lab 01/23/18 0205 01/23/18 1000 01/24/18 0315  CALCIUM 7.7* 7.7* 7.7*    CBC Recent Labs  Lab 02/05/2018 1719  01/23/18 1000 01/23/18 2157 01/24/18 0315  WBC 12.6*  --   --   --  14.1*  HGB 3.5*   < > 6.9* 10.0* 10.0*  HCT 12.4*   < > 21.7* 30.9* 30.9*  PLT 43*  --   --   --  60*   < > = values in this interval not displayed.    Coag's Recent Labs  Lab 02/10/2018 1719 01/24/18 0455  APTT 42* 36  INR 1.30 1.32    Sepsis Markers No results for input(s): LATICACIDVEN, PROCALCITON, O2SATVEN in the last 168 hours.  ABG Recent  Labs  Lab 01/24/18 0135 01/24/18 0427  PHART 7.410 7.347*  PCO2ART 29.2* 40.8  PO2ART 56.3* 318.0*    Liver Enzymes Recent Labs  Lab 02/05/2018 1719 01/23/18 0205 01/24/18 0315  AST '24 21 28  ' ALT 10* 10* 11*  ALKPHOS 55 52 58  BILITOT 1.7* 1.5* 2.7*  ALBUMIN 2.5* 2.3* 2.4*    Cardiac Enzymes No results for input(s): TROPONINI, PROBNP in the last 168 hours.  Glucose No results for input(s): GLUCAP in the last 168 hours.  Imaging Dg Chest Port 1 View  Result Date: 01/24/2018 CLINICAL DATA:  67 y/o  M; central line placement. EXAM: PORTABLE CHEST 1 VIEW COMPARISON:  01/24/2018 chest radiograph. FINDINGS: Stable endotracheal tube tip 2.3 cm above carina. Stable cardiomegaly given projection and technique. Left central venous catheter tip projects over mid SVC. No pneumothorax. Stable hazy opacities of the lungs and ovoid opacity in right mid lung zone probably representing loculated effusion on the fissure. Probable small right effusion. Right apical pleuroparenchymal scarring. No acute osseous abnormality identified. IMPRESSION: Left central venous catheter tip projects over mid SVC. No pneumothorax. Stable hazy opacities of the lungs. Electronically Signed   By: Kristine Garbe M.D.   On: 01/24/2018 04:48   Portable Chest Xray  Result Date: 01/24/2018 CLINICAL DATA:  Respiratory failure.  Post intubation. EXAM: PORTABLE CHEST 1 VIEW COMPARISON:  Radiographs 2 hours prior. FINDINGS: Endotracheal tube is in place tip 2.3 cm from the carina. Unchanged heart size and mild cardiomegaly. Bilateral perihilar opacities with slight improvement from prior exam. Ovoid density persist in the right mid lung may be fluid in the fissure or focal pulmonary process. Right costophrenic angle excluded from the field of view. Biapical pleuroparenchymal scarring. No pneumothorax. IMPRESSION: 1. Endotracheal tube 2.3 cm from the carina. 2. Bilateral perihilar opacities with slight improvement from  prior exam. 3. Unchanged ovoid density in the periphery of the right lung may be fluid in the fissure or focal pulmonary process such as pneumonia or pulmonary mass. Electronically Signed   By: Jeb Levering M.D.   On: 01/24/2018 04:19   Dg Chest Port 1 View  Result Date: 01/24/2018 CLINICAL DATA:  Hemoptysis.  Cough. EXAM: PORTABLE CHEST 1 VIEW COMPARISON:  None. FINDINGS: The heart is enlarged. Aortic atherosclerosis. Bilateral perihilar opacities favor pulmonary edema. Peripheral 3.4 cm ovoid density in the right mid lung. Possible small right pleural effusion. Biapical pleuroparenchymal scarring. No pneumothorax. IMPRESSION: 1. Cardiomegaly with perihilar opacities, favoring pulmonary edema. Suspect CHF. 2. Ovoid 3.4 cm density in the periphery of the right mid lung may be loculated fluid in the fissure versus consolidation or pulmonary mass. Possible small  right pleural effusion. 3. Recommend radiographic follow-up, should right midlung opacity persist, recommend chest CT. Electronically Signed   By: Jeb Levering M.D.   On: 01/24/2018 03:04   STUDIES:   MRI(4/7):  1. Numerous small foci of acute/early subacute infarction are present throughout the left hemisphere, largely in a watershed distribution. 2. Late subacute to chronic hemorrhagic infarct in right occipital lobe. 3. No acute hemorrhage, mass effect, or herniation. 4. Background of moderate chronic microvascular ischemic changes and parenchymal volume loss of the brain.  CT head (4/7): Areas of low-density in the right occipital lobe and within the left hemispheric white matter worrisome for acute/subacute infarctions. No hemorrhage or mass effect.  MRA (4/7): 1. Left M2 superior division appears to have common origin with left A1 and is occluded proximally. 2. No additional large vessel occlusion of the intracranial circulation identified.  MRA neck: Severe motion artifact. Patent carotid systems and  vertebral arteries. No definite evidence for hemodynamically significant stenosis by NASCET criteria.  CT abdomen/pelvis 4/7: negative for acute intra-abdominal processes   CXR 4/9: diffuse, bilateral airspace opacities, no focal consolidations or pleural effusions    CULTURES: None   ANTIBIOTICS: None   SIGNIFICANT EVENTS: 4/9 > admitted to ICU + bronchoscopy + intubation   LINES/TUBES: PIV LUE x2 and RUE    ASSESSMENT / PLAN:  PULMONARY A: Acute hypoxic respiratory failure - CXR with diffuse airspace disease, concern for blast crisis vs DAH vs TRALI  Bilateral pulmonary infiltrates P:   Intubated on prvc: o2=80, peep=5, rr=16, tv=340, ppeak=17, plateau=11 On ards protocol with 6cc/kg tv. Wean to keep p0260 and o2 sat>92% ABG post 1 hr intubation-> 7.345/41/318/22 Fentanyl and propofol gtt  Versed PRN  Bronchoscopy with serial aliquots of 60cc saline done. Pending RVP and resp cultures Pending blood cultures Droplet precausions Continue cefepime  CARDIOVASCULAR A:  Tachycardia - likely secondary to hypoxia HFpEF -(01/23/18) EF 55%, G2DD, mild MR  P:    Got one dose of IV lasix 27m due to volume overload concern MAP goal > 65 Monitor volume status   RENAL A:   Hyponatremia - Na 136, resolved Possible AKI - Cr 1.4 on admission, unknown baseline renal function.  P:    Renal function improving from 1.4 to 1.31 today Put out 7036mof urine over the past 24 hrs. Got one dose IV lasix 40   GASTROINTESTINAL A:   Upper GI bleed   P:   GI following, plan for EGD 4/9 Hb=10, hct=30.9 NPO  Octreotide gtt  Protonix gtt, consider discontinuing after EGD in the setting of thrombocytopenia    HEMATOLOGIC A:   Anemia of chronic disease  Thrombocytopenia with concern for AML   P:  On admission myeloblasts seen on smear, Hgb 10 s/p 5u pRBCs and plt in the 40s.   LDH=448 (4/9), Fibrinogen=617, Ferritin=724, iron=60, tibc=231, uric acid 8.6 Aptt=36,  pt=16.3, inr=1.32, retic count=66.2 Haptoglobin pending Flow cytometry pending Multiple myeloma panel pending Monitor CBC and CMP Oncology consulted appreciate recommendations   INFECTIOUS A:   Immunocompromised in the setting of likely hematological malignancy  P:    Cefepime 4/3>present Droplet precautions  Follow up RVP     ENDOCRINE A:   None  P:   - NPO  - CBG goal < 180   NEUROLOGIC A:   L + R occipital lobe hemorrhagic strokes - no tPA given, Neuro deficits include R facial droop, RUE weakness, and inability to follow commands.  P:   Appreciate neurology following-  holding antiplatelets and embolic workup in the setting of severe thrombocytopenia  RASS goal: -1 - -2 - Stroke workup: Hgb A1c pending, LDL 44,  TTE EF 55%, G2DD, mild MR, lower extremity dopplers pending, PT/OT recs pending   FAMILY  - Updates: Family aware patient's clinical status deteriorated and will be transferred to the ICU for closer monitoring and intubation.    Pulmonary and [place] Pager: [phone]  01/24/2018, 7:01 AM

## 2018-01-24 NOTE — Procedures (Signed)
Central Venous Catheter Insertion Procedure Note Tommy Bentley [id number] [birth year]  Procedure: Insertion of Central Venous Catheter Indications: Assessment of intravascular volume, Drug and/or fluid administration and Frequent blood sampling  Procedure Details Consent: Unable to obtain consent because of altered level of consciousness. Time Out: Verified patient identification, verified procedure, site/side was marked, verified correct patient position, special equipment/implants available, medications/allergies/relevent history reviewed, required imaging and test results available.  Performed  Maximum sterile technique was used including antiseptics, cap, gloves, gown, hand hygiene, mask and sheet. Skin prep: Chlorhexidine; local anesthetic administered A antimicrobial bonded/coated triple lumen catheter was placed in the left internal jugular vein using the Seldinger technique.  Evaluation Blood flow good Complications: No apparent complications Patient did tolerate procedure well. Chest X-ray ordered to verify placement.  CXR: pending.  Procedure performed under direct ultrasound guidance for real time vessel cannulation.      Montey Hora, [place] [facility] Pager: [phone]  or [phone] 01/24/2018, 4:15 AM

## 2018-01-24 NOTE — Progress Notes (Signed)
ETT pulled back 1 cm per MD.  ETT now at 22 cm at the lips.

## 2018-01-24 NOTE — Progress Notes (Signed)
ETT pulled back 1 cm per MD.  ETT now at 23 cm at the lip.

## 2018-01-24 NOTE — Progress Notes (Signed)
Patient came to 47M as a rapid. Bedside report was given by his previous RN. Patient appeared to be alert, and oriented and was on a non-rebreather. Did not have a chance to assess neuro function, however, according to his nurse, he was able to follow commands before arrival to 47M. Intubation was performed at bedside. Patient is currently sedated.

## 2018-01-24 NOTE — Progress Notes (Signed)
Assisted MD with bronchoscopy at bedside. Pt tolerated procedure well.  Sample labeled and sent to lab.

## 2018-01-24 NOTE — Progress Notes (Addendum)
STROKE TEAM PROGRESS NOTE   SUBJECTIVE (INTERVAL HISTORY) Worsening respiratory status led to intubation and transfer to medical ICU during the night. Now on vent, sedated with propofol.  Oncology has seen pt. Workup for possible lymphoma, leukemia and hematological issues underway.  Niece and nephew at the bedside. Nephew states that on Sunday he assisted pt to bathroom with handheld assist. He had a "pull" on the right side of his face but no weakness in his arm or leg. He was able to eat without difficulty. His nephew helped hold the spoon as his hands were contracted.    OBJECTIVE Vitals:   01/24/18 0900 01/24/18 0919 01/24/18 1000 01/24/18 1100  BP: (!) 88/48 (!) 90/50 (!) 90/52 (!) 92/49  Pulse:  72    Resp: '15 20 15 15  ' Temp:      TempSrc:      SpO2:  100%    Weight:      Height:        CBC:  Recent Labs  Lab 01/26/2018 1719  01/23/18 2157 01/24/18 0315  WBC 12.6*  --   --  14.1*  NEUTROABS 1.4*  --   --   --   HGB 3.5*   < > 10.0* 10.0*  HCT 12.4*   < > 30.9* 30.9*  MCV 78.5  --   --  78.8  PLT 43*  --   --  60*   < > = values in this interval not displayed.    Basic Metabolic Panel:  Recent Labs  Lab 01/23/18 1000 01/24/18 0315  NA 134* 136  K 4.2 4.0  CL 104 109  CO2 18* 17*  GLUCOSE 120* 142*  BUN 30* 27*  CREATININE 1.39* 1.31*  CALCIUM 7.7* 7.7*    Lipid Panel:     Component Value Date/Time   CHOL 73 01/23/2018 0029   TRIG 116 01/24/2018 0455   HDL 14 (L) 01/23/2018 0029   CHOLHDL 5.2 01/23/2018 0029   VLDL 15 01/23/2018 0029   LDLCALC 44 01/23/2018 0029   HgbA1c: No results found for: HGBA1C Urine Drug Screen: No results found for: LABOPIA, COCAINSCRNUR, LABBENZ, AMPHETMU, THCU, LABBARB  Alcohol Level No results found for: ETH  IMAGING  Ct Head Wo Contrast 01/18/2018 Areas of low-density in the right occipital lobe and within the left hemispheric white matter worrisome for acute/subacute infarctions. No hemorrhage or mass effect. The  infarctions could be embolic or watershed. Electronically Signed   By: Nelson Chimes M.D.   On: 01/28/2018 16:08   Mr Brain Wo Contrast 01/23/2018 1. Numerous small foci of acute/early subacute infarction are present throughout the left hemisphere, largely in a watershed distribution. 2. Late subacute to chronic hemorrhagic infarct in right occipital lobe. 3. No acute hemorrhage, mass effect, or herniation. 4. Background of moderate chronic microvascular ischemic changes and parenchymal volume loss of the brain.   [name] Head Wo Contrast 01/23/2018  1. Left M2 superior division appears to have common origin with left A1 and is occluded proximally. 2. No additional large vessel occlusion of the intracranial circulation identified. MRA neck: Severe motion artifact. Patent carotid systems and vertebral arteries. No definite evidence for hemodynamically significant stenosis by NASCET criteria.   [name] Neck Wo Contrast 01/23/2018 Severe motion artifact. Patent carotid systems and vertebral arteries. No definite evidence for hemodynamically significant stenosis by NASCET criteria.   Ct Abdomen Pelvis W Contrast 01/25/2018  1. Small foci of consolidation in the right middle and lower lobe may  represent pneumonia. 2. Severe calcific atherosclerosis of abdominal aorta and iliofemoral arteries. 3. Moderate prostate enlargement. 4. No acute process of abdomen or pelvis identified.   Lower Extremity Venous Dopplers  pending  2D Echocardiogram  EF 55-60% with no source of embolus.    PHYSICAL EXAM Mental Status: Patient sedated on propofol and intubated on vent in medical ICU. Unresponsive. Pupils small and round, reactive to light. Corneal's present. No Doll's. Patient with no spontaneous movement. No withdrawal to pain, No response. HRR. Breath sounds clear.   ASSESSMENT/PLAN Tommy Bentley is a 67 y.o. male who has not seen a physician in years with a known hx of smoking (stopped 14 days ago) and reported  occasional ETOH use presenting following flu 2 weeks ago with a 2 week history of unsteady gait, slurred speech, decreased speech output, R facial, and R arm weakness. Also with bowel & bladder incontinence, hgb 3.5, Cr 1.48, alb 1.5, Na 131.  Stroke:  Acute left MCA infarct in setting of L M2 occlusion along with subacute R occipital infarct and prior R occipital infarct. Infarcts possibly embolic vs. severe anemia in the setting of left M2 occlusion and right carotid siphon stenosis.   CT head ow density right occipital lobe and left hemispheric white matter  MRI head numerous left brain embolic infarcts. Subacute right hemorrhagic infarct. Small vessel disease. Atrophy.   MRA head L M2 occlusion, right carotid siphon stenosis  MRA neck motion degraded. large arteries appear patent  2D Echo  EF 55-60%. No source of embolus   Lower extremity Dopplers pending  Consider CTA head and neck to look at vasculature if/when pt can tolerate (creatinine elevated)  LDL 44  HgbA1c pending  SCDs for VTE prophylaxis Diet NPO time specified  No antithrombotic prior to admission, now on No antithrombotic given severe anemia and possible GI bleed  Ongoing aggressive stroke risk factor management  Therapy recommendations:  pending  Disposition:  Pending  Embolic workup and treatment on hold given medical complexity. Will follow.   Other Stroke Risk Factors  Advanced age  Cigarette smoker  Alcohol use, stopped 6 months ago  UDS / ETOH level not performed   PVD - severe atherosclerosis abdominal aorta and iliofemoral arteries  Other Active Problems  Respiratory failure, intubated.   PNA - aspiration vs CAP. On abx  Severe anemia, thrombocytopenia and leukoerythroblastic reaction. Oncology reviewing/evaluating tests, consider bone marrow. Plan transfusion for < 8. Hemoglobin 3.5 ->10.0. PLT 60. Na 131->136. WBC 12.6->14.1. Lactate dehydrogenase 376 H,    GI consultation. Needs EGD.  Ferritin 724 H, TIBC 231 L.   Malnutrition alb 2.3  Possible acute kidney injury. Creatinine 1.48  Hospital day # 1  Burnetta Sabin, MSN, APRN, ANVP-BC, AGPCNP-BC Advanced Practice Stroke Nurse [place] for Schedule & Pager information 01/24/2018 11:26 AM    ATTENDING NOTE: I reviewed above note and agree with the assessment and plan. I have made any additions or clarifications directly to the above note. Pt was seen and examined.   67 yo M with Hx of smoker admitted for unsteady gait for 2 weeks, right sided weakness and aphasia for 3 days with b/b dysfunction. CT left MCA and right MCA/PCA infarcts. MRI showed subacute right occipital MCA/PCA infarcts and acute left MCA scattered infarcts. MRA head showed left M2 occlusion and right carotid siphon stenosis. EF 55-60% and LE venous doppler pending. LDL 44 and A1C < 4.2.  Pt also has evidence of PVD and  intracranial stenosis. Smoking likely to be the risk factor. CT abdomen and pelvis showed severe atherosclerosis AA and iliofemoral arteries. MRA head also showed left M2 occlusion and right carotid siphon stenosis. His stroke likely due to severe anemia with intracranial stenosis/occlusion. cardioembolic can not be completely ruled out. However, due to severe anemia and thrombocytopenia, will hold off embolic work up for now. Also holding off antiplatelet at this time.   Hb improved well after PRBC transfusion yesterday, today 10.0. However, platelet still low at 60. Overnight pt developed respiratory failure and was intubated, transferred to ICU for ventilation. Currently still on sedation and not responsive on exam. Weak corneal and gag reflex, not moving extremities on pain stimulation. Pending GI and oncology on board for work up of severe anemia and thrombocytopenia. I had long discussion with great niece and other family members at bedside, updated pt current condition, treatment plan and potential prognosis. They  expressed understanding and appreciation. However, family relayed to me that if after work up, there is poor prognosis or poor quality of life, they are considering hospice or palliative care.    Rosalin Hawking, MD PhD Stroke Neurology 01/24/2018 12:42 PM  This patient is critically ill due to stroke, severe anemia, GIB, thrombocytopenia, respiratory failure and at significant risk of neurological worsening, death form recurrent stroke, anemia, sepsis, shock, bleeding, respiratory failure. This patient's care requires constant monitoring of vital signs, hemodynamics, respiratory and cardiac monitoring, review of multiple databases, neurological assessment, discussion with family, other specialists and medical decision making of high complexity. I spent 40 minutes of neurocritical care time in the care of this patient.  To contact Stroke Continuity provider, please refer to [email or web address] After hours, contact General Neurology

## 2018-01-24 NOTE — Progress Notes (Addendum)
Paged about new onset hemoptysis by beside nurse. Patient was coughing earlier this evening but no hemoptysis at that time. Upon arrival to the room, the patient was found to have pink sputum in bedside canister, bright pink blood on washcloth, and blood on corner of patient's mouth. Patient saturating 86-88% on 2L [place], which was turned up to 5L [place] and patient stably saturating >88% on this intervention.  Patient does not speak english, but interview last PM was performed via grand-niece who was bedside and speaks patient's primary language.  Physical exam Gen: Thin man sitting comfortably in bed wearing nasal cannula intermittently coughing but in no acute distress CV: RRR, no murmurs, 2+ radial pulses Pulm: No accessory muscle or nasal flaring. Diffuse crackles bilaterally with upper airway rhonchi.   Assessment/Plan: Patient now in acute respiratory failure with hypoxia. He initially presented yesterday for AMS and was found to have watershed ischemic infarct on L, R occipital hemorrhagic stroke, Hgb =3.6 and platelets = 43. Review of peripheral smear with hematologist is highly concerning for blood cell malignancy as etiology of anemia and thrombocytopenia. Patient has received 5 units of pRBC's today. Echo from earlier today shows G2DD with preserved EF, which is new diagnosis for patient. Differential for acute respiratory failure includes transfusion induced lung injury given history of recent transfusion within past 24 hours, alveolar hemorrhage given profound thrombocytopenia and pink sputum, and volume overload given new diagnosis of HFpEF and receipt of aggressive fluids and blood volume since admission.  -STAT CXR -STAT ABG shows hypoxia on 5L  -STAT CMP, CBC -IV Lasix 40 mg -Advance oxygen to maintain saturation >90% -Will consult PCCM

## 2018-01-24 NOTE — Care Management (Signed)
CM continuing to follow for discharge planning.  Pt is currently ventilated with sedation

## 2018-01-24 NOTE — Progress Notes (Signed)
PT Cancellation Note  Patient Details Name: Tommy Bentley MRN: [id number] DOB: Jan 05, 1951   Cancelled Treatment:    Reason Eval/Treat Not Completed: (Rapid response. Pt sedated and intubated this am.)   Denice Paradise 01/24/2018, 8:43 AM  [facility] [phone] (pager)

## 2018-01-24 NOTE — Significant Event (Signed)
Rapid Response Event Note  I received a call from 2 staff nurses that requested that I come to the bedside per CCM team. When I arrived, CCM team informed methat patient will need to be transferred to the ICU for intubation/mechanical ventilation support. I transported the patient on the monitor and 100% oxygen via NRB 15L to the ICU. Patient tolerated transfer.   Call time 226 Arrival Time 230 End Time 300

## 2018-01-24 NOTE — Progress Notes (Signed)
Bilateral lower extremity venous duplex has been completed. Negative for DVT.  01/24/18 3:55 PM Tommy Bentley RVT

## 2018-01-24 NOTE — Progress Notes (Signed)
OT Cancellation Note  Patient Details Name: Tommy Bentley MRN: [id number] DOB: 23-Oct-1950   Cancelled Treatment:    Reason Eval/Treat Not Completed: Medical issues which prohibited therapy(Rapid response. Pt sedated and intubated this am.)  [facility], OT/L  [phone] 01/24/2018 01/24/2018, 7:07 AM

## 2018-01-24 NOTE — Consult Note (Addendum)
PULMONARY / CRITICAL CARE MEDICINE   Name: Tommy Bentley MRN: [id number] DOB: 1951/04/24    ADMISSION DATE:  02/10/2018 CONSULTATION DATE:  01/24/2018  REFERRING MD: Dr. Aldine Contes   CHIEF COMPLAINT:  Hypoxia   HISTORY OF PRESENT ILLNESS:   Tommy Bentley is a 67yo M with no PMH who presented to the ED 4/7 with acute encephalopathy 3 weeks after diagnosis of influenza and was found to have a watershed L + R occipital hemorrhagic strokes. No tPA given 3-day history of symptoms including weakness and aphasia.  His blood work was remarkable for Hgb 3.9 s/p 5u pRBCS on 4/8 and Plts 40s. Review of peripheral smear highly concerning for AML after myeloblasts noted on smear and plan for bone marrow biopsy 4/9. In addition, family reported both melena and hematochezia on day of presentation. GI consulted with plan for EGD 4/9. Patient had been hemodynamically stable until this evening when he developed a productive cough of frothy, blood-tinged sputum and was noted to be hypoxic to low 80s and tachycardic with HR 100-110s. ABG 7.02/14/55/18. CXR showed prominent, diffuse bilateral airspace infiltrates and PCCM consulted for admission due to concern for TRALI vs pulmonary hemorrhage.   PAST MEDICAL HISTORY :  He  has a past medical history of Hard of hearing.  PAST SURGICAL HISTORY: He  has no past surgical history on file.  No Known Allergies  No current facility-administered medications on file prior to encounter.    Current Outpatient Medications on File Prior to Encounter  Medication Sig  . diclofenac (VOLTAREN) 50 MG EC tablet Take 1 tablet (50 mg total) by mouth 3 (three) times daily as needed for moderate pain. (Patient not taking: Reported on 02/12/2018)  . Diclofenac Sodium & Capsaicin 1.5 & 0.025 % THPK Apply 1 application topically 2 (two) times daily. (Patient not taking: Reported on 01/17/2018)  . gabapentin (NEURONTIN) 300 MG capsule Take 1 capsule (300 mg total) by mouth at bedtime. (Patient not  taking: Reported on 01/21/2018)  . predniSONE (DELTASONE) 50 MG tablet Take 1 pill daily for 5 days. (Patient not taking: Reported on 01/31/2018)    FAMILY HISTORY:  His has no family status information on file.    SOCIAL HISTORY: He  reports that he has been smoking.  He has been smoking about 0.33 packs per day. He has never used smokeless tobacco. He reports that he drinks alcohol. He reports that he does not use drugs.  REVIEW OF SYSTEMS:   Patient does not speak English and unable to provide information.   SUBJECTIVE:  Patient does not speak English and unable to provide history. He was noted to be coughing and restless when seen. On 6L [place] and satting 87-92%. Does not follow commands.   VITAL SIGNS: BP (!) 141/93 (BP Location: Right Arm)   Pulse 96   Temp 98.4 F (36.9 C) (Oral)   Resp (!) 21   Ht _0  (1.6 m)   Wt 130 lb (59 kg)   SpO2 93%   BMI 23.03 kg/m   HEMODYNAMICS:    VENTILATOR SETTINGS:    INTAKE / OUTPUT: I/O last 3 completed shifts: In: 1642.3 [Blood:1192.3; IV Piggyback:450] Out: -   PHYSICAL EXAMINATION: General:  Chronically-ill appearing, elderly male who appears restless and in respiratory distress  Neuro: patient is alert but restless and unable to follow commands , moves all 4 extremities spontaneously  HEENT:  No sclera icterus or conjunctival injection, MMM  Cardiovascular:  Tachycardic, regular rhythm, no mrg  Lungs:  Diffuse rales, though difficult exam as patient moaning constantly  Abdomen:  Soft, NTND, normoactive bowel sounds  Musculoskeletal:  Able to move all 4 extremities spontaneously  Skin:  No rashes   LABS:  BMET Recent Labs  Lab 02/06/2018 1719 01/23/18 0205 01/23/18 1000  NA 131* 131* 134*  K 3.9 4.1 4.2  CL 99* 101 104  CO2 21* 17* 18*  BUN 30* 30* 30*  CREATININE 1.48* 1.49* 1.39*  GLUCOSE 139* 142* 120*    Electrolytes Recent Labs  Lab 02/07/2018 1719 01/23/18 0205 01/23/18 1000  CALCIUM 8.3* 7.7* 7.7*     CBC Recent Labs  Lab 01/26/2018 1719 02/05/2018 2053 01/23/18 1000 01/23/18 2157  WBC 12.6*  --   --   --   HGB 3.5* 3.9* 6.9* 10.0*  HCT 12.4* 13.7* 21.7* 30.9*  PLT 43*  --   --   --     Coag's Recent Labs  Lab 01/16/2018 1719  APTT 42*  INR 1.30    Sepsis Markers No results for input(s): LATICACIDVEN, PROCALCITON, O2SATVEN in the last 168 hours.  ABG Recent Labs  Lab 01/24/18 0135  PHART 7.410  PCO2ART 29.2*  PO2ART 56.3*    Liver Enzymes Recent Labs  Lab 02/08/2018 1719 01/23/18 0205  AST 24 21  ALT 10* 10*  ALKPHOS 55 52  BILITOT 1.7* 1.5*  ALBUMIN 2.5* 2.3*    Cardiac Enzymes No results for input(s): TROPONINI, PROBNP in the last 168 hours.  Glucose No results for input(s): GLUCAP in the last 168 hours.  Imaging Dg Chest Port 1 View  Result Date: 01/24/2018 CLINICAL DATA:  Hemoptysis.  Cough. EXAM: PORTABLE CHEST 1 VIEW COMPARISON:  None. FINDINGS: The heart is enlarged. Aortic atherosclerosis. Bilateral perihilar opacities favor pulmonary edema. Peripheral 3.4 cm ovoid density in the right mid lung. Possible small right pleural effusion. Biapical pleuroparenchymal scarring. No pneumothorax. IMPRESSION: 1. Cardiomegaly with perihilar opacities, favoring pulmonary edema. Suspect CHF. 2. Ovoid 3.4 cm density in the periphery of the right mid lung may be loculated fluid in the fissure versus consolidation or pulmonary mass. Possible small right pleural effusion. 3. Recommend radiographic follow-up, should right midlung opacity persist, recommend chest CT. Electronically Signed   By: Jeb Levering M.D.   On: 01/24/2018 03:04    STUDIES:  MRI/MRA brain 4/7> acute/subacute infarctions throughout L hemisphere, late subacute to chronic hemorrhagic infarct in R occipital lobe  CT abdomen/pelvis 4/7> negative for acute intra-abdominal processes  CXR 4/9 > diffuse, bilateral airspace opacities, no focal consolidations or pleural effusions    CULTURES: None    ANTIBIOTICS: None   SIGNIFICANT EVENTS: 4/9 > admitted to ICU + bronchoscopy + intubation   LINES/TUBES: PIV LUE x2 and RUE   DISCUSSION: 67yo M with no PMH who presented  4/7 with acute encephalopathy secondary to watershed L + R occipital hemorrhagic strokes. At the time he was also found to be anemic (now s/p 5u pRBCs) and thrombocytopenic highly suspicious for leukemia (AML) given observed myeloblasts in blood smear. Patient acutely decompensated this evening. He developed a productive cough of frothy, blood-tinged sputum and became tachycardic and hypoxic while on supplemental oxygen. CXR with diffuse bilateral airspace disease. His presentation is concerning for blast crisis. Differential also includes DAH 2/2 hematological malignancy vs TRALI vs non-cardiogenic pulmonary edema. Less likely leukostasis given WBC 14.1.    ASSESSMENT / PLAN:  PULMONARY A: Acute hypoxic respiratory failure - CXR with diffuse airspace disease, concern for blast crisis vs DAH  vs TRALI  No underlying lung disease  P:   - Plan to intubate 4/9 - Fentanyl and propofol gtt  - Versed PRN  - Bronchoscopy with sequential lavages to r/o DAH. If DAH, plan for high dose steroids - Post-intubation CXR   CARDIOVASCULAR A:  Tachycardia - likely secondary to hypoxia, s/p IV Lasix 40 mg x1 due to concern for volume overload  HFpEF - EF 55%, G2DD, mild MR; hemodynamically stable  P:   - MAP goal > 65 - Monitor volume status  - No mIVF   RENAL A:   Hyponatremia - Na 131 ? AKI - Cr 1.4 on admission, unknown baseline renal function. S/p IV Lasix 40 mg x1 with UOP 300cc P:   - Monitor renal function  - Monitor UOP. Plan for one more dose of IV Lasix 40 if no improvement within next hour   GASTROINTESTINAL A:   Upper GI bleed - presented with melena. GI consulted and initial plan for EGD 4/9.  P:   - GI following, plan for EGD 4/9 - H/H monitoring  - NPO  - Octreotide gtt  - Protonix gtt, consider  discontinuing after EGD in the setting of thrombocytopenia   HEMATOLOGIC A:   Anemia an thrombocytopenia on presentation concerning for AML - myeloblasts seen on smear, Hgb 10 s/p 5u pRBCs and plt in the 40s. Concern for blast crisis  P:  - LDH 376, uric acid 8.6 - Repeating CBC and CMP - Oncology consulted  - Follow up STAT cbc, haptoglobin, fibrinogen, and coags   INFECTIOUS A:   Immunocompromised in the setting of likely hematological malignancy  P:   - Cefepime 4/3> - Droplet precautions  - Follow up RVP     ENDOCRINE A:   No history of DM, CBG 142 P:   - NPO  - CBG goal < 180   NEUROLOGIC A:   L + R occipital lobe hemorrhagic strokes - no tPA given, outside of window. Neuro deficits include R facial droop, RUE weakness, and inability to follow commands.  P:   - Neurology following, holding antiplatelets and embolic workup in the setting of severe thrombocytopenia  - Intubated as above  - RASS goal: -1 - -2 - Stroke workup: Hgb A1c pending, LDL 44,  TTE EF 55%, G2DD, mild MR, lower extremity dopplers pending, PT/OT recs pending   FAMILY  - Updates: Family aware patient's clinical status deteriorated and will be transferred to the ICU for closer monitoring and intubation.    Pulmonary and [place] 01/24/2018, 3:14 AM

## 2018-01-24 NOTE — Progress Notes (Signed)
SLP Cancellation Note  Patient Details Name: Orange Hilligoss MRN: [id number] DOB: 11-23-1950   Cancelled treatment:       Reason Eval/Treat Not Completed: Patient not medically ready (intubated, sedated).   Germain Osgood 01/24/2018, 8:12 AM  Germain Osgood, M.A. CCC-SLP [phone]

## 2018-01-25 ENCOUNTER — Inpatient Hospital Stay (HOSPITAL_COMMUNITY): Payer: Medicare Other

## 2018-01-25 DIAGNOSIS — I63412 Cerebral infarction due to embolism of left middle cerebral artery: Secondary | ICD-10-CM

## 2018-01-25 LAB — POCT I-STAT 3, ART BLOOD GAS (G3+)
Acid-base deficit: 6 mmol/L — ABNORMAL HIGH (ref 0.0–2.0)
BICARBONATE: 19.6 mmol/L — AB (ref 20.0–28.0)
O2 Saturation: 97 %
PCO2 ART: 40.7 mmHg (ref 32.0–48.0)
PH ART: 7.293 — AB (ref 7.350–7.450)
PO2 ART: 99 mmHg (ref 83.0–108.0)
Patient temperature: 99.2
TCO2: 21 mmol/L — ABNORMAL LOW (ref 22–32)

## 2018-01-25 LAB — HAPTOGLOBIN: Haptoglobin: 95 mg/dL (ref 34–200)

## 2018-01-25 LAB — TYPE AND SCREEN
ABO/RH(D): A POS
ANTIBODY SCREEN: POSITIVE
DAT, IgG: NEGATIVE
UNIT DIVISION: 0
UNIT DIVISION: 0
Unit division: 0
Unit division: 0

## 2018-01-25 LAB — GLUCOSE, CAPILLARY
GLUCOSE-CAPILLARY: 79 mg/dL (ref 65–99)
GLUCOSE-CAPILLARY: 84 mg/dL (ref 65–99)
GLUCOSE-CAPILLARY: 92 mg/dL (ref 65–99)
GLUCOSE-CAPILLARY: 92 mg/dL (ref 65–99)
Glucose-Capillary: 82 mg/dL (ref 65–99)
Glucose-Capillary: 90 mg/dL (ref 65–99)
Glucose-Capillary: 94 mg/dL (ref 65–99)

## 2018-01-25 LAB — BASIC METABOLIC PANEL
Anion gap: 11 (ref 5–15)
Anion gap: 11 (ref 5–15)
BUN: 31 mg/dL — AB (ref 6–20)
BUN: 31 mg/dL — ABNORMAL HIGH (ref 6–20)
CHLORIDE: 111 mmol/L (ref 101–111)
CO2: 20 mmol/L — ABNORMAL LOW (ref 22–32)
CO2: 20 mmol/L — AB (ref 22–32)
CREATININE: 1.9 mg/dL — AB (ref 0.61–1.24)
Calcium: 7.4 mg/dL — ABNORMAL LOW (ref 8.9–10.3)
Calcium: 7.7 mg/dL — ABNORMAL LOW (ref 8.9–10.3)
Chloride: 112 mmol/L — ABNORMAL HIGH (ref 101–111)
Creatinine, Ser: 1.63 mg/dL — ABNORMAL HIGH (ref 0.61–1.24)
GFR calc Af Amer: 49 mL/min — ABNORMAL LOW (ref >60)
GFR calc Af Amer: 41 mL/min — ABNORMAL LOW (ref >60)
GFR calc non Af Amer: 42 mL/min — ABNORMAL LOW (ref >60)
GFR, EST NON AFRICAN AMERICAN: 35 mL/min — AB (ref >60)
GLUCOSE: 91 mg/dL (ref 65–99)
Glucose, Bld: 95 mg/dL (ref 65–99)
POTASSIUM: 4 mmol/L (ref 3.5–5.1)
Potassium: 3.8 mmol/L (ref 3.5–5.1)
SODIUM: 142 mmol/L (ref 135–145)
Sodium: 143 mmol/L (ref 135–145)

## 2018-01-25 LAB — CBC
HCT: 27.3 % — ABNORMAL LOW (ref 39.0–52.0)
HEMATOCRIT: 28.2 % — AB (ref 39.0–52.0)
HEMATOCRIT: 27.6 % — AB (ref 39.0–52.0)
HEMATOCRIT: 28 % — AB (ref 39.0–52.0)
HEMOGLOBIN: 8.9 g/dL — AB (ref 13.0–17.0)
Hemoglobin: 8.8 g/dL — ABNORMAL LOW (ref 13.0–17.0)
Hemoglobin: 8.6 g/dL — ABNORMAL LOW (ref 13.0–17.0)
Hemoglobin: 8.7 g/dL — ABNORMAL LOW (ref 13.0–17.0)
MCH: 26.1 pg (ref 26.0–34.0)
MCH: 26.7 pg (ref 26.0–34.0)
MCH: 26.4 pg (ref 26.0–34.0)
MCH: 26.9 pg (ref 26.0–34.0)
MCHC: 31.6 g/dL (ref 30.0–36.0)
MCHC: 31.9 g/dL (ref 30.0–36.0)
MCHC: 30.7 g/dL (ref 30.0–36.0)
MCHC: 31.9 g/dL (ref 30.0–36.0)
MCV: 82.7 fL (ref 78.0–100.0)
MCV: 83.6 fL (ref 78.0–100.0)
MCV: 85.9 fL (ref 78.0–100.0)
MCV: 84.3 fL (ref 78.0–100.0)
Platelets: 46 10*3/uL — ABNORMAL LOW (ref 150–400)
Platelets: 44 10*3/uL — ABNORMAL LOW (ref 150–400)
Platelets: 56 10*3/uL — ABNORMAL LOW (ref 150–400)
Platelets: 48 10*3/uL — ABNORMAL LOW (ref 150–400)
RBC: 3.41 MIL/uL — ABNORMAL LOW (ref 4.22–5.81)
RBC: 3.3 MIL/uL — AB (ref 4.22–5.81)
RBC: 3.26 MIL/uL — AB (ref 4.22–5.81)
RBC: 3.24 MIL/uL — ABNORMAL LOW (ref 4.22–5.81)
RDW: 21.6 % — ABNORMAL HIGH (ref 11.5–15.5)
RDW: 21.6 % — AB (ref 11.5–15.5)
RDW: 24 % — ABNORMAL HIGH (ref 11.5–15.5)
RDW: 22.4 % — AB (ref 11.5–15.5)
WBC: 11.4 10*3/uL — ABNORMAL HIGH (ref 4.0–10.5)
WBC: 11.5 10*3/uL — AB (ref 4.0–10.5)
WBC: 12.1 10*3/uL — ABNORMAL HIGH (ref 4.0–10.5)
WBC: 12.7 10*3/uL — AB (ref 4.0–10.5)

## 2018-01-25 LAB — BPAM RBC
BLOOD PRODUCT EXPIRATION DATE: 201904222359
BLOOD PRODUCT EXPIRATION DATE: 201904242359
BLOOD PRODUCT EXPIRATION DATE: 201904252359
Blood Product Expiration Date: 201904252359
ISSUE DATE / TIME: 201904080203
ISSUE DATE / TIME: 201904080447
ISSUE DATE / TIME: 201904081619
ISSUE DATE / TIME: 201904092149
UNIT TYPE AND RH: 6200
Unit Type and Rh: 6200
Unit Type and Rh: 6200
Unit Type and Rh: 6200

## 2018-01-25 LAB — HEMOGLOBINOPATHY EVALUATION
HGB A: 98.1 % (ref 96.4–98.8)
HGB VARIANT: 0 %
Hgb A2 Quant: 1.9 % (ref 1.8–3.2)
Hgb C: 0 %
Hgb F Quant: 0 % (ref 0.0–2.0)
Hgb S Quant: 0 %

## 2018-01-25 LAB — KAPPA/LAMBDA LIGHT CHAINS
KAPPA FREE LGHT CHN: 148 mg/L — AB (ref 3.3–19.4)
Kappa, lambda light chain ratio: 0.6 (ref 0.26–1.65)
LAMDA FREE LIGHT CHAINS: 246.1 mg/L — AB (ref 5.7–26.3)

## 2018-01-25 LAB — PHOSPHORUS: Phosphorus: 4.5 mg/dL (ref 2.5–4.6)

## 2018-01-25 LAB — MAGNESIUM: Magnesium: 2.2 mg/dL (ref 1.7–2.4)

## 2018-01-25 LAB — PROCALCITONIN: Procalcitonin: 1.46 ng/mL

## 2018-01-25 MED ORDER — FUROSEMIDE 10 MG/ML IJ SOLN
INTRAMUSCULAR | Status: AC
  Filled 2018-01-25: qty 4

## 2018-01-25 MED ORDER — PANTOPRAZOLE SODIUM 40 MG IV SOLR
40.0000 mg | Freq: Every day | INTRAVENOUS | Status: DC
  Administered 2018-01-26 – 2018-01-29 (×4): 40 mg via INTRAVENOUS
  Filled 2018-01-25 (×4): qty 40

## 2018-01-25 MED ORDER — FUROSEMIDE 10 MG/ML IJ SOLN
80.0000 mg | Freq: Once | INTRAMUSCULAR | Status: AC
  Administered 2018-01-25: 80 mg via INTRAVENOUS
  Filled 2018-01-25: qty 8

## 2018-01-25 MED ORDER — FUROSEMIDE 10 MG/ML IJ SOLN
40.0000 mg | Freq: Once | INTRAMUSCULAR | Status: AC
  Administered 2018-01-25: 40 mg via INTRAVENOUS

## 2018-01-25 NOTE — Clinical Social Work Note (Signed)
Clinical Social Work Assessment  Patient Details  Name: Tommy Bentley MRN: [id number] Date of Birth: 03-06-1951  Date of referral:  01/18/18               Reason for consult:  Facility Placement                Permission sought to share information with:  Family Supports Permission granted to share information::  Yes, Verbal Permission Granted  Name::     Tommy Bentley  Agency::  family  Relationship::  niece  Contact Information:  [name] [phone]  Housing/Transportation Living arrangements for the past 2 months:  Single Family Home(with other family members. ) Source of Information:  Other (Comment Required)(niece) Patient Interpreter Needed:  None Criminal Activity/Legal Involvement Pertinent to Current Situation/Hospitalization:  No - Comment as needed Significant Relationships:  Other Family Members Lives with:  Relatives Do you feel safe going back to the place where you live?  Yes Need for family participation in patient care:  Yes (Comment)  Care giving concerns:  CSW spoke with pt's niece at bedside as pt is intubated at this time. Niece expressed that she nor other family members have any concerns at this time.    Social Worker assessment / plan:  CSW spoke with pt's niece at bedside. CSW was informed that pt is from home with other family memebers. Pt is orginally from [place]. CSW was informed that pt has support from relatives as well as other nieces and nephews at this time. CSW observed that pt remains intubated at this time and unable to speak with CSW. Pt was lying in bed resting at this time.   Employment status:  Retired Forensic scientist:  [employer] PT Recommendations:  [place] / Referral to community resources:  Skilled Nursing Facility(spoke with niece about placemetn options in the [place] area.)  Patient/Family's Response to care:  Pt's niece appeared to be very knowledgeable about pt's care and the possibility for  other services at the time of discharge.   Patient/Family's Understanding of and Emotional Response to Diagnosis, Current Treatment, and Prognosis:  Niece appeared to be understanding and agreeable to SNF placement for pt at the time of discharge. Emotional response was positive and hopeful at this time.   Emotional Assessment Appearance:  Appears stated age Attitude/Demeanor/Rapport:  Unable to Assess Affect (typically observed):  Unable to Assess Orientation:  (pt intubated. ) Alcohol / Substance use:  Not Applicable Psych involvement (Current and /or in the community):  No (Comment)  Discharge Needs  Concerns to be addressed:  Basic Needs Readmission within the last 30 days:  No Current discharge risk:  Dependent with Mobility Barriers to Discharge:  Continued Medical Work up   [employer], [place] 01/25/2018, 11:57 AM

## 2018-01-25 NOTE — Progress Notes (Signed)
PT Cancellation Note  Patient Details Name: Efstathios Sawin MRN: [id number] DOB: 11-02-1950   Cancelled Treatment:    Reason Eval/Treat Not Completed: Medical issues which prohibited therapy(SEdated and intubated.  Is weaning but nurse said HOLD today.) Will check back tomorrow.   Godfrey Pick Luccas [place] 01/25/2018, 10:12 AM [facility] [phone] (pager)

## 2018-01-25 NOTE — Consult Note (Signed)
            [facility] CM Primary Care Navigator  01/25/2018  Tommy Bentley June 27, 1951 [id number]  Attempt to see patient at the bedside to identify possible discharge needs but she was transferred to another unit (medical ICU- 38M 11) due to worsening respiratory status that led to intubation.  Will attempt to see patient at another time when out of ICU.   For additional questions please contact:  Edwena Felty A. Harvis Mabus, BSN, RN-BC [facility] PRIMARY CARE Navigator Cell: [phone]

## 2018-01-25 NOTE — Progress Notes (Signed)
OT Cancellation Note  Patient Details Name: Tommy Bentley MRN: [id number] DOB: 1951-02-13   Cancelled Treatment:    Reason Eval/Treat Not Completed: Medical issues which prohibited therapy(sedated and intubated)  Peri Maris  [phone] 01/25/2018, 8:18 AM

## 2018-01-25 NOTE — Progress Notes (Signed)
STROKE TEAM PROGRESS NOTE   SUBJECTIVE (INTERVAL HISTORY) Great niece is at bedside.  Patient eyes open, awake, however, agitated with tachycardia and hypertension, likely due to intolerance of decreased sedation. Will put back on further sedation.  GI and oncology on board.  Concerning for lymphoma versus leukemia.  EGD on hold.  Still has thrombocytopenia, hemoglobin stable.    OBJECTIVE Vitals:   01/25/18 1527 01/25/18 1600 01/25/18 1617 01/25/18 1700  BP:  (!) 99/45 (!) 107/48 (!) 105/52  Pulse:  76 79 77  Resp:  10 18 11   Temp: 98 F (36.7 C)     TempSrc: Oral     SpO2:  99% 96% 98%  Weight:      Height:        CBC:  Recent Labs  Lab 01/27/2018 1719  01/25/18 0431 01/25/18 0858  WBC 12.6*   < > 11.5* 12.1*  NEUTROABS 1.4*  --   --   --   HGB 3.5*   < > 8.8* 8.6*  HCT 12.4*   < > 27.6* 28.0*  MCV 78.5   < > 83.6 85.9  PLT 43*   < > 44* 56*   < > = values in this interval not displayed.    Basic Metabolic Panel:  Recent Labs  Lab 01/24/18 0315 01/25/18 0431  NA 136 142  K 4.0 4.0  CL 109 111  CO2 17* 20*  GLUCOSE 142* 95  BUN 27* 31*  CREATININE 1.31* 1.63*  CALCIUM 7.7* 7.4*  MG  --  2.2  PHOS  --  4.5    Lipid Panel:     Component Value Date/Time   CHOL 73 01/23/2018 0029   TRIG 116 01/24/2018 0455   HDL 14 (L) 01/23/2018 0029   CHOLHDL 5.2 01/23/2018 0029   VLDL 15 01/23/2018 0029   LDLCALC 44 01/23/2018 0029   HgbA1c:  Lab Results  Component Value Date   HGBA1C <4.2 (L) 01/23/2018   Urine Drug Screen: No results found for: LABOPIA, COCAINSCRNUR, LABBENZ, AMPHETMU, THCU, LABBARB  Alcohol Level No results found for: ETH  IMAGING  Ct Head Wo Contrast 02/03/2018 Areas of low-density in the right occipital lobe and within the left hemispheric white matter worrisome for acute/subacute infarctions. No hemorrhage or mass effect. The infarctions could be embolic or watershed. Electronically Signed   By: Nelson Chimes M.D.   On: 02/12/2018 16:08    Mr Brain Wo Contrast 01/23/2018 1. Numerous small foci of acute/early subacute infarction are present throughout the left hemisphere, largely in a watershed distribution. 2. Late subacute to chronic hemorrhagic infarct in right occipital lobe. 3. No acute hemorrhage, mass effect, or herniation. 4. Background of moderate chronic microvascular ischemic changes and parenchymal volume loss of the brain.   [name] Head Wo Contrast 01/23/2018  1. Left M2 superior division appears to have common origin with left A1 and is occluded proximally. 2. No additional large vessel occlusion of the intracranial circulation identified. MRA neck: Severe motion artifact. Patent carotid systems and vertebral arteries. No definite evidence for hemodynamically significant stenosis by NASCET criteria.   [name] Neck Wo Contrast 01/23/2018 Severe motion artifact. Patent carotid systems and vertebral arteries. No definite evidence for hemodynamically significant stenosis by NASCET criteria.   Ct Abdomen Pelvis W Contrast 01/21/2018  1. Small foci of consolidation in the right middle and lower lobe may represent pneumonia. 2. Severe calcific atherosclerosis of abdominal aorta and iliofemoral arteries. 3. Moderate prostate enlargement. 4. No acute  process of abdomen or pelvis identified.   Lower Extremity Venous Dopplers  pending  2D Echocardiogram  EF 55-60% with no source of embolus.    PHYSICAL EXAM  Temp:  [98 F (36.7 C)-99.6 F (37.6 C)] 98 F (36.7 C) (04/10 1527) Pulse Rate:  [75-126] 77 (04/10 1700) Resp:  [10-24] 11 (04/10 1700) BP: (91-170)/(43-85) 105/52 (04/10 1700) SpO2:  [96 %-100 %] 98 % (04/10 1700) FiO2 (%):  [40 %] 40 % (04/10 1617)  General - cachectic, well developed, intubated with light sedation but agitated.  Ophthalmologic - Fundi not visualized due to noncooperation  Cardiovascular - Regular rhythm, but tachycardia.  Neuro - intubated, on light sedation but agitated, eyes open, not  following commands.  Left gaze preference, barely able to cross midline, blinking to visual threat on the left, but not right. PERRL.  Facial asymmetry not able to test due to ET tube. LUE and LLE actively movement against gravity. RUE drift 3/5 and RLE withdraw 3-/5 on pain. DTR 1+ in no Babinski. Sensation, coordination and gait not tested.   ASSESSMENT/PLAN [name] is a 67 y.o. male who has not seen a physician in years with a known hx of smoking (stopped 14 days ago) and reported occasional ETOH use presenting following flu 2 weeks ago with a 2 week history of unsteady gait, slurred speech, decreased speech output, R facial, and R arm weakness. Also with bowel & bladder incontinence, hgb 3.5, Cr 1.48, alb 1.5, Na 131.  Stroke:  Acute left MCA infarct in setting of L M2 occlusion along with subacute R occipital infarct and prior R occipital infarct. Infarcts possibly embolic vs. severe anemia in the setting of left M2 occlusion and right carotid siphon stenosis.   CT head ow density right occipital lobe and left hemispheric white matter  MRI head numerous left brain embolic infarcts. Subacute right hemorrhagic infarct. Small vessel disease. Atrophy.   MRA head L M2 occlusion, right carotid siphon stenosis  MRA neck motion degraded. large arteries appear patent  2D Echo  EF 55-60%. No source of embolus   Lower extremity Dopplers no DVT  LDL 44  HgbA1c < 4.2  SCDs for VTE prophylaxis Diet NPO time specified  No antithrombotic prior to admission, now on No antithrombotic given severe anemia and possible GI bleed and thrombocytopenia  Ongoing aggressive stroke risk factor management  Therapy recommendations:  pending  Disposition:  Pending  Embolic workup and treatment on hold given anemia and thrombocytopenia as well as medical complexity.   Respiratory failure  Intubated for airway protection  On ventilation  CCM on board  PVD and intracranial stenosis  Smoking  likely to be the risk factor.   CT abdomen and pelvis showed severe atherosclerosis AA and iliofemoral arteries.  MRA head also showed left M2 occlusion and right carotid siphon stenosis.  Severe anemia and thrombocytopenia  S/p blood transfusion  Hemoglobin 8.4-7.9-8.8-8.6  Platelet 43-35-44-56  History of melena   GI on board, EGD on hold  Hematology on board, concerning for lymphoma versus leukemia  Workup underway  Hold off anticoagulation and antiplatelet at this time  Other Stroke Risk Factors  Advanced age  Cigarette smoker  Alcohol use, stopped 6 months ago  Other Active Problems   PNA - aspiration vs CAP. On abx  Malnutrition alb 2.3  Possible acute kidney injury. Creatinine 1.48  This patient is critically ill due to stroke, severe anemia, GIB, thrombocytopenia, respiratory failure and at significant risk of neurological worsening,  death form recurrent stroke, anemia, sepsis, shock, bleeding, respiratory failure. This patient's care requires constant monitoring of vital signs, hemodynamics, respiratory and cardiac monitoring, review of multiple databases, neurological assessment, discussion with great niece and medical decision making of high complexity. I spent 40 minutes of neurocritical care time in the care of this patient.   Rosalin Hawking, MD PhD Stroke Neurology 01/25/2018 5:39 PM    To contact Stroke Continuity provider, please refer to [email or web address] After hours, contact General Neurology

## 2018-01-25 NOTE — Progress Notes (Addendum)
PULMONARY / CRITICAL CARE MEDICINE   Name: Tommy Bentley MRN: [id number] DOB: 1950-10-20    ADMISSION DATE:  02/11/2018  REFERRING MD:  Dr. Shan Levans ([facility])  CHIEF COMPLAINT:  Aphasia, bloody stool, and right sided weakness  Brief patient summary: 67 y.o [place] male with unknown med hx presented with aphasia, right weakness. Imaging sig for bilateral mca infarcts and right pca infarct. MRA with right carotid siphon stenosis. LE venous doppler negative for dvt. Labs sig for hb=3.5, plt=40 got 5u on admission. GI following, plan is to do EGD once stable. Onc on board thinks maybe mantle cell lymphoma, aml, or apl. Flow cytometry results consistent with AML and pathology review showed Leukoerythroblastic smear with shistocytes and NRBC's. Awaiting kappa lamda chain.  SUBJECTIVE:  Patient is sedated and intubated  VITAL SIGNS: BP (!) 108/46   Pulse 78   Temp 98.4 F (36.9 C) (Oral)   Resp 16   Ht _0  (1.6 m)   Wt 130 lb (59 kg)   SpO2 97%   BMI 23.03 kg/m   VENTILATOR SETTINGS: Vent Mode: PRVC FiO2 (%):  [40 %-80 %] 40 % Set Rate:  [16 bmp] 16 bmp Vt Set:  [340 mL] 340 mL PEEP:  [5 cmH20] 5 cmH20 Plateau Pressure:  [6 cmH20-12 cmH20] 12 cmH20  INTAKE / OUTPUT: I/O last 3 completed shifts: In: 23 [I.V.:3879; Blood:315; IV Piggyback:100] Out: 1010 [Urine:1010]  PHYSICAL EXAMINATION: Physical Exam  Constitutional:  sedated  HENT:  Head: Normocephalic and atraumatic.  Respiratory: No respiratory distress.  Mild amount of basilar crackles  GI: He exhibits no distension.  Tense abdomen  Musculoskeletal: He exhibits no edema.  Neurological: He is alert.  Skin: No erythema.   LABS:  BMET Recent Labs  Lab 01/23/18 0205 01/23/18 1000 01/24/18 0315  NA 131* 134* 136  K 4.1 4.2 4.0  CL 101 104 109  CO2 17* 18* 17*  BUN 30* 30* 27*  CREATININE 1.49* 1.39* 1.31*  GLUCOSE 142* 120* 142*    Electrolytes Recent Labs  Lab 01/23/18 0205 01/23/18 1000  01/24/18 0315  CALCIUM 7.7* 7.7* 7.7*    CBC Recent Labs  Lab 01/24/18 1959 01/25/18 0222 01/25/18 0431  WBC 13.8* 11.4* 11.5*  HGB 7.9* 8.9* 8.8*  HCT 24.4* 28.2* 27.6*  PLT 35* 46* PENDING    Coag's Recent Labs  Lab 01/25/2018 1719 01/24/18 0455  APTT 42* 36  INR 1.30 1.32    Sepsis Markers Recent Labs  Lab 01/24/18 0955 01/24/18 1448  LATICACIDVEN 1.3 1.3  PROCALCITON 1.27  --     ABG Recent Labs  Lab 01/24/18 0135 01/24/18 0427 01/25/18 0342  PHART 7.410 7.347* 7.293*  PCO2ART 29.2* 40.8 40.7  PO2ART 56.3* 318.0* 99.0    Liver Enzymes Recent Labs  Lab 02/12/2018 1719 01/23/18 0205 01/24/18 0315  AST _1 ALT 10* 10* 11*  ALKPHOS 55 52 58  BILITOT 1.7* 1.5* 2.7*  ALBUMIN 2.5* 2.3* 2.4*    Cardiac Enzymes No results for input(s): TROPONINI, PROBNP in the last 168 hours.  Glucose Recent Labs  Lab 01/24/18 1003 01/24/18 1145 01/24/18 1536 01/24/18 2007 01/25/18 0013 01/25/18 0423  GLUCAP 114* 111* 108* 100* 94 92    Imaging No results found.    STUDIES:  MRI(4/7):  1. Numerous small foci of acute/early subacute infarction are present throughout the left hemisphere, largely in a watershed distribution. 2. Late subacute to chronic hemorrhagic infarct in right occipital lobe. 3. No acute hemorrhage, mass effect,  or herniation. 4. Background of moderate chronic microvascular ischemic changes and parenchymal volume loss of the brain.  CT head (4/7): Areas of low-density in the right occipital lobe and within the left hemispheric white matter worrisome for acute/subacute infarctions. No hemorrhage or mass effect.  MRA (4/7): 1. Left M2 superior division appears to have common origin with left A1 and is occluded proximally. 2. No additional large vessel occlusion of the intracranial circulation identified.  MRA neck: Severe motion artifact. Patent carotid systems and vertebral arteries. No definite evidence for  hemodynamically significant stenosis by NASCET criteria.  CT abdomen/pelvis 4/7:negative for acute intra-abdominal processes   CXR 4/9:diffuse, bilateral airspace opacities, no focal consolidations or pleural effusions   CULTURES: None  ANTIBIOTICS: None  SIGNIFICANT EVENTS: 4/9 > admitted to ICU + bronchoscopy + intubation  LINES/TUBES: PIV LUE x2 and RUE  ASSESSMENT / PLAN:  PULMONARY A: Acute hypoxic respiratory failure - CXR with diffuse airspace disease, concern for blast crisis vs DAH vs TRALI  Bilateral pulmonary infiltrates P: Intubated on prvc: o2=40, peep 5, rr=16, tv=340. Couldn't tolerate change to ps. Will continue to wean sedation today prior to change.  Fentanyl100 and propofol 30 Versed PRN  Bronchoscopy with serial aliquots of 60cc saline done. Petichiae on bronchial wall without edema. Culture shows no growth.  CARDIOVASCULAR A: Tachycardia - likely secondary to hypoxia HFpEF -(01/23/18) EF 55%, G2DD, mild MR  P:  MAP goal > 65 Monitor volume status   RENAL A: Hyponatremia - Na 136, resolved AKI-likely prerenal from poor po intake  P:  Cr bump from 1.3 yesterday to 1.63 today (4/10) Patient is +1.8L over the past 24 hrs  GASTROINTESTINAL A: Upper GI bleed   P: GI following, appreciate recs Hb=10, hct=30.9 NPO Octreotide gtt  Protonix gtt Will not do EGD at this time due to acute heme process  HEMATOLOGIC A: Microcytic anemia  Leukoerythroblastic reaction Thrombocytopenia  P: On admission myeloblasts seen on smear, Hgb 10 s/p 5u pRBCs and plt in the 40s.   CBC q6hrs Hb: 8.8, hct:27.6 received 1 prbc last night due to hb being 7.9 LDH=448 (4/9), Fibrinogen=617, Ferritin=724, iron=60, tibc=231, uric acid 8.6 Aptt=36, pt=16.3, inr=1.32, retic count=66.2 Lactic acid=1.3 (4/9) Kappa/lamda light chain pending Hemoglobinopathy eval pending Haptoglobin pending Path smear: Leukoerythroblastic  smear with shistocytes and NRBC's. Flow cytometry-consistent with AML: phenotype of abnormal cells was CD13, CD34, CD117, HLA-Dr, MPO Multiple myeloma panel pending Monitor CBC and CMP Oncology consultedappreciate recommendations-thought to be lymphoma with leukemic component (Mantle cell lymphoma with burkitts component) also want to determine if possible AML vs APL  If hb<8 then transfuse If platelets<50,000 and active bleeding or if platelets<20,000 then transfuse platelets  INFECTIOUS A: Immunocompromised in the setting of likely hematological malignancy  P:  Cefepime 4/3>present Droplet precautions discontinued RVP negative  BAL (4/9) no growth   ENDOCRINE A: None  P: - NPO  - CBG goal < 180  NEUROLOGIC A: L + R occipital lobe hemorrhagic strokes - no tPA given, Neuro deficits include R facial droop, RUE weakness, and inability to follow commands.  P: Appreciate neurology following- holding antiplatelets and embolic workup in the setting of severe thrombocytopenia RASS goal:-1 - -2 - Stroke workup: HgbA1c<4.2,LDL 44, TTE EF 55%, G2DD, mild MR, lower extremity doppler did not show any presence of dvt  FAMILY - Updates:Family at bedside yesterday (4/10) updated about clinical status and treatment plan. They wish to pursue hospice/palliative care if aggressive therapy is required.   Pulmonary and Critical Care  [place] Pager: [phone]  01/25/2018, 6:13 AM

## 2018-01-25 NOTE — Progress Notes (Signed)
Nutrition Follow-up  DOCUMENTATION CODES:   Severe malnutrition in context of acute illness/injury  INTERVENTION:   When able to begin enteral nutrition, recommend:  Vital AF 1.2 at 50 ml/h (1200 ml per day)  Provides 1440 kcal, 90 gm protein, 973 ml free water daily  NUTRITION DIAGNOSIS:   Severe Malnutrition related to acute illness, lethargy/confusion(CVA) as evidenced by mild fat depletion, moderate fat depletion, moderate muscle depletion, severe muscle depletion.  Ongoing  GOAL:   Patient will meet greater than or equal to 90% of their needs  Unmet  MONITOR:   Vent status, Labs, I & O's  ASSESSMENT:   67 year old male with PMH of occasional alcohol use and regular tobacco use who presented to ED with slurred speech, weakness, and difficulty with balance. CT showed acute infarct in R occipital lobe.  Discussed patient with RN today. Patient was intubated and transferred to the ICU on 4/9. No plans to begin enteral nutrition at this time due to concern for GI bleeding. Work-up ongoing for new diagnosis of AML.   Patient is currently intubated on ventilator support MV: 7.8 L/min Temp (24hrs), Avg:98.8 F (37.1 C), Min:97.6 F (36.4 C), Max:99.6 F (37.6 C)  Propofol: 5.3 ml/hr providing 140 kcal from lipid.  Labs reviewed. CBG's: 92-82-79 Medications reviewed and include octreotide, thiamine, and folic acid.  Diet Order:  Diet NPO time specified  EDUCATION NEEDS:   Not appropriate for education at this time  Skin:  Skin Assessment: Reviewed RN Assessment  Last BM:  4/9 type 6, black, brown  Height:   Ht Readings from Last 1 Encounters:  01/31/2018 5\' 3"  (1.6 m)    Weight:   Wt Readings from Last 1 Encounters:  02/14/2018 130 lb (59 kg)    Ideal Body Weight:  56.4 kg  BMI:  Body mass index is 23.03 kg/m.  Estimated Nutritional Needs:   Kcal:  1530  Protein:  80-90 gm  Fluid:  1.5-1.7 L/day    Molli Barrows, RD, LDN, [place] Pager  [phone] After Hours Pager [phone]

## 2018-01-25 NOTE — Progress Notes (Signed)
   Following up on patient for anemia / dark heme + stools. Saw in consult on 4/8, plan was for EGD but peripheral smear was worrisome for hematologic disorder. Hematology evaluating and concerned for possible aggressive lymphoma with leukemic component. We were asked to hold off on EGD.   In the interim patient was transferred to unit for acute respiratory failure with CVA. He is on the vent. Discussion with Niece in the room. She understands that GI workup is on hold for now but that we will return to case if / when needed.   Please call if GI services are needed. Thanks

## 2018-01-25 NOTE — Progress Notes (Signed)
SLP Cancellation Note  Patient Details Name: Tommy Bentley MRN: [id number] DOB: [birth year]   Cancelled treatment:       Reason Eval/Treat Not Completed: Patient not medically ready (intubated, sedated).   Germain Osgood 01/25/2018, 8:27 AM  Germain Osgood, M.A. CCC-SLP [phone]

## 2018-01-26 ENCOUNTER — Inpatient Hospital Stay (HOSPITAL_COMMUNITY): Payer: Medicare Other

## 2018-01-26 DIAGNOSIS — N179 Acute kidney failure, unspecified: Secondary | ICD-10-CM

## 2018-01-26 DIAGNOSIS — C9200 Acute myeloblastic leukemia, not having achieved remission: Secondary | ICD-10-CM

## 2018-01-26 LAB — GLUCOSE, CAPILLARY
GLUCOSE-CAPILLARY: 82 mg/dL (ref 65–99)
GLUCOSE-CAPILLARY: 76 mg/dL (ref 65–99)
GLUCOSE-CAPILLARY: 149 mg/dL — AB (ref 65–99)
Glucose-Capillary: 77 mg/dL (ref 65–99)
Glucose-Capillary: 137 mg/dL — ABNORMAL HIGH (ref 65–99)

## 2018-01-26 LAB — BLOOD GAS, ARTERIAL
Acid-base deficit: 3.4 mmol/L — ABNORMAL HIGH (ref 0.0–2.0)
BICARBONATE: 21.9 mmol/L (ref 20.0–28.0)
Drawn by: 345601
FIO2: 40
LHR: 16 {breaths}/min
MECHVT: 340 mL
O2 SAT: 98.8 %
PATIENT TEMPERATURE: 98.6
PCO2 ART: 45.3 mmHg (ref 32.0–48.0)
PEEP: 5 cmH2O
PH ART: 7.306 — AB (ref 7.350–7.450)
pO2, Arterial: 155 mmHg — ABNORMAL HIGH (ref 83.0–108.0)

## 2018-01-26 LAB — CULTURE, RESPIRATORY W GRAM STAIN

## 2018-01-26 LAB — BASIC METABOLIC PANEL
ANION GAP: 11 (ref 5–15)
BUN: 34 mg/dL — ABNORMAL HIGH (ref 6–20)
CALCIUM: 8 mg/dL — AB (ref 8.9–10.3)
CO2: 21 mmol/L — AB (ref 22–32)
CREATININE: 2.18 mg/dL — AB (ref 0.61–1.24)
Chloride: 112 mmol/L — ABNORMAL HIGH (ref 101–111)
GFR calc non Af Amer: 30 mL/min — ABNORMAL LOW (ref >60)
GFR, EST AFRICAN AMERICAN: 35 mL/min — AB (ref >60)
Glucose, Bld: 90 mg/dL (ref 65–99)
Potassium: 3.5 mmol/L (ref 3.5–5.1)
SODIUM: 144 mmol/L (ref 135–145)

## 2018-01-26 LAB — PROCALCITONIN: PROCALCITONIN: 1.62 ng/mL

## 2018-01-26 LAB — MULTIPLE MYELOMA PANEL, SERUM
Albumin SerPl Elph-Mcnc: 2.7 g/dL — ABNORMAL LOW (ref 2.9–4.4)
Albumin/Glob SerPl: 0.6 — ABNORMAL LOW (ref 0.7–1.7)
Alpha 1: 0.5 g/dL — ABNORMAL HIGH (ref 0.0–0.4)
Alpha2 Glob SerPl Elph-Mcnc: 0.7 g/dL (ref 0.4–1.0)
B-Globulin SerPl Elph-Mcnc: 1.3 g/dL (ref 0.7–1.3)
Gamma Glob SerPl Elph-Mcnc: 2.5 g/dL — ABNORMAL HIGH (ref 0.4–1.8)
Globulin, Total: 5 g/dL — ABNORMAL HIGH (ref 2.2–3.9)
IGA: 467 mg/dL — AB (ref 61–437)
IGM (IMMUNOGLOBULIN M), SRM: 112 mg/dL (ref 20–172)
IgG (Immunoglobin G), Serum: 2593 mg/dL — ABNORMAL HIGH (ref 700–1600)
TOTAL PROTEIN ELP: 7.7 g/dL (ref 6.0–8.5)

## 2018-01-26 LAB — CULTURE, RESPIRATORY: CULTURE: NO GROWTH

## 2018-01-26 LAB — CBC
HEMATOCRIT: 26.4 % — AB (ref 39.0–52.0)
Hemoglobin: 8.4 g/dL — ABNORMAL LOW (ref 13.0–17.0)
MCH: 27 pg (ref 26.0–34.0)
MCHC: 31.8 g/dL (ref 30.0–36.0)
MCV: 84.9 fL (ref 78.0–100.0)
PLATELETS: 42 10*3/uL — AB (ref 150–400)
RBC: 3.11 MIL/uL — ABNORMAL LOW (ref 4.22–5.81)
RDW: 22.6 % — AB (ref 11.5–15.5)
WBC: 10.7 10*3/uL — AB (ref 4.0–10.5)

## 2018-01-26 MED ORDER — POTASSIUM CHLORIDE 10 MEQ/100ML IV SOLN
10.0000 meq | INTRAVENOUS | Status: AC
  Administered 2018-01-26 (×4): 10 meq via INTRAVENOUS
  Filled 2018-01-26 (×4): qty 100

## 2018-01-26 MED ORDER — DEXMEDETOMIDINE HCL IN NACL 200 MCG/50ML IV SOLN
0.0000 ug/kg/h | INTRAVENOUS | Status: DC
  Filled 2018-01-26: qty 50

## 2018-01-26 MED ORDER — DEXTROSE 10 % IV SOLN
INTRAVENOUS | Status: DC
  Administered 2018-01-26 – 2018-01-27 (×2): via INTRAVENOUS

## 2018-01-26 NOTE — Progress Notes (Signed)
STROKE TEAM PROGRESS NOTE   SUBJECTIVE (INTERVAL HISTORY) Two granddaughters are at bedside.  Patient eyes open, still intubated, but agitated, trying to get up from bed, with tachycardia and hypertension, likely due to intolerance of decreased sedation. Oncology concerning for AML but recommend bone marrow biopsy if pt recovers from current condition.  Family is discussing about palliative care.    OBJECTIVE Vitals:   01/26/18 0858 01/26/18 0900 01/26/18 1000 01/26/18 1114  BP:  (!) 101/45 (!) 101/47 (!) 99/52  Pulse:  76 77 81  Resp:  '15 20 20  ' Temp: 97.9 F (36.6 C)     TempSrc: Oral     SpO2:  100% 98% 99%  Weight:      Height:        CBC:  Recent Labs  Lab 01/28/2018 1719  01/25/18 2043 01/26/18 0419  WBC 12.6*   < > 12.7* 10.7*  NEUTROABS 1.4*  --   --   --   HGB 3.5*   < > 8.7* 8.4*  HCT 12.4*   < > 27.3* 26.4*  MCV 78.5   < > 84.3 84.9  PLT 43*   < > 48* 42*   < > = values in this interval not displayed.    Basic Metabolic Panel:  Recent Labs  Lab 01/25/18 0431 01/25/18 1724 01/26/18 0419  NA 142 143 144  K 4.0 3.8 3.5  CL 111 112* 112*  CO2 20* 20* 21*  GLUCOSE 95 91 90  BUN 31* 31* 34*  CREATININE 1.63* 1.90* 2.18*  CALCIUM 7.4* 7.7* 8.0*  MG 2.2  --   --   PHOS 4.5  --   --     Lipid Panel:     Component Value Date/Time   CHOL 73 01/23/2018 0029   TRIG 116 01/24/2018 0455   HDL 14 (L) 01/23/2018 0029   CHOLHDL 5.2 01/23/2018 0029   VLDL 15 01/23/2018 0029   LDLCALC 44 01/23/2018 0029   HgbA1c:  Lab Results  Component Value Date   HGBA1C <4.2 (L) 01/23/2018   Urine Drug Screen: No results found for: LABOPIA, COCAINSCRNUR, LABBENZ, AMPHETMU, THCU, LABBARB  Alcohol Level No results found for: [place] I have personally reviewed the radiological images below and agree with the radiology interpretations.  Ct Head Wo Contrast 01/21/2018 Areas of low-density in the right occipital lobe and within the left hemispheric white matter  worrisome for acute/subacute infarctions. No hemorrhage or mass effect. The infarctions could be embolic or watershed. Electronically Signed   By: Nelson Chimes M.D.   On: 02/12/2018 16:08   Mr Brain Wo Contrast 01/23/2018 1. Numerous small foci of acute/early subacute infarction are present throughout the left hemisphere, largely in a watershed distribution. 2. Late subacute to chronic hemorrhagic infarct in right occipital lobe. 3. No acute hemorrhage, mass effect, or herniation. 4. Background of moderate chronic microvascular ischemic changes and parenchymal volume loss of the brain.   [name] Head Wo Contrast 01/23/2018  1. Left M2 superior division appears to have common origin with left A1 and is occluded proximally. 2. No additional large vessel occlusion of the intracranial circulation identified. MRA neck: Severe motion artifact. Patent carotid systems and vertebral arteries. No definite evidence for hemodynamically significant stenosis by NASCET criteria.   [name] Neck Wo Contrast 01/23/2018 Severe motion artifact. Patent carotid systems and vertebral arteries. No definite evidence for hemodynamically significant stenosis by NASCET criteria.   Ct Abdomen Pelvis W Contrast 01/18/2018  1.  Small foci of consolidation in the right middle and lower lobe may represent pneumonia. 2. Severe calcific atherosclerosis of abdominal aorta and iliofemoral arteries. 3. Moderate prostate enlargement. 4. No acute process of abdomen or pelvis identified.   Lower Extremity Venous Dopplers  pending  2D Echocardiogram  EF 55-60% with no source of embolus.    PHYSICAL EXAM  Temp:  [97.9 F (36.6 C)-98.3 F (36.8 C)] 97.9 F (36.6 C) (04/11 0858) Pulse Rate:  [75-138] 81 (04/11 1114) Resp:  [10-22] 20 (04/11 1114) BP: (91-193)/(45-86) 99/52 (04/11 1114) SpO2:  [93 %-100 %] 99 % (04/11 1114) FiO2 (%):  [40 %] 40 % (04/11 1114) Weight:  [98 lb 8.7 oz (44.7 kg)] 98 lb 8.7 oz (44.7 kg) (04/11  0358)  General - cachectic, well developed, intubated with light sedation but agitated.  Ophthalmologic - Fundi not visualized due to noncooperation  Cardiovascular - Regular rhythm, but tachycardia.  Neuro - intubated, on light sedation but agitated, eyes open, not following commands.  Left gaze preference, barely able to cross midline, blinking to visual threat on the left, but not right. PERRL.  Facial asymmetry not able to test due to ET tube. LUE and LLE actively movement against gravity. RUE proximal 3-/5 and distal 0/5 and RLE withdraw 2/5 on pain. DTR 1+ in no Babinski. Sensation, coordination and gait not tested.   ASSESSMENT/PLAN Mr. Swan Zayed is a 67 y.o. male who has not seen a physician in years with a known hx of smoking (stopped 14 days ago) and reported occasional ETOH use presenting following flu 2 weeks ago with a 2 week history of unsteady gait, slurred speech, decreased speech output, R facial, and R arm weakness. Also with bowel & bladder incontinence, hgb 3.5, Cr 1.48, alb 1.5, Na 131.  Stroke:  Acute left MCA infarct in setting of L M2 occlusion. Infarcts likely due to severe anemia in the setting of left M2 occlusion and right carotid siphon stenosis, although cardioembolic still in DDx  Resultant - agitated on vent with light sedation, not following commands  CT head ow density right occipital lobe and left hemispheric white matter  MRI head numerous left brain embolic infarcts. Subacute right hemorrhagic infarcts. Small vessel disease. Atrophy.   MRA head L M2 occlusion, right carotid siphon stenosis  MRA neck motion degraded. large arteries appear patent  2D Echo  EF 55-60%. No source of embolus   Lower extremity Dopplers no DVT  LDL 44  HgbA1c < 4.2  SCDs for VTE prophylaxis Diet NPO time specified  No antithrombotic prior to admission, now on No antithrombotic given severe anemia and possible GI bleed and thrombocytopenia  Ongoing aggressive stroke  risk factor management  Therapy recommendations:  pending  Disposition:  Pending  Embolic workup and treatment on hold given anemia and thrombocytopenia as well as medical complexity.   Given complex respiratory failure, neuro deficit and hematological disorder, agree to involve palliative care for goal of care discussion  Respiratory failure  Intubated for airway protection  On ventilation  CCM on board  Agitated on low dose sedation   PVD and intracranial stenosis  Smoking likely to be the risk factor.   CT abdomen and pelvis showed severe atherosclerosis AA and iliofemoral arteries.  MRA head also showed left M2 occlusion and right carotid siphon stenosis.  Likely the cause for current stroke  Severe anemia and thrombocytopenia  S/p blood transfusion  Hemoglobin 8.4-7.9-8.8-8.6-8.4  Platelet 43-35-44-56-42  History of melena   GI on  board, EGD on hold  Hematology on board, concerning for lymphoma versus AML  Oncology recommend bone marrow biopsy if pt recovers from current conditions  Hold off anticoagulation and antiplatelet at this time  AKI  Creatinine 1.48->2.18  Worsening kidney function  Other Stroke Risk Factors  Advanced age  Cigarette smoker  Alcohol use, stopped 6 months ago  Other Active Problems   PNA - aspiration vs CAP. On abx  Malnutrition alb 2.3  leukocytosis  Given serious condition with respiratory failure, neuro deficit, hematological condition, worsening kidney function, agree with palliative care involvement for goal of care discussion.   Rosalin Hawking, MD PhD Stroke Neurology 01/26/2018 12:09 PM    To contact Stroke Continuity provider, please refer to [email or web address] After hours, contact General Neurology

## 2018-01-26 NOTE — Progress Notes (Signed)
PT Cancellation Note  Patient Details Name: Tommy Bentley MRN: [id number] DOB: Sep 05, 1951   Cancelled Treatment:    Reason Eval/Treat Not Completed: Medical issues which prohibited therapy;Patient not medically ready(Pt not appropriate for PT. Sign off per nurse. Reorder if status changes.)Thanks.    [place] 01/26/2018, 9:14 AM [facility] [phone] (pager)

## 2018-01-26 NOTE — Progress Notes (Addendum)
Oncology short note  Courtesy visit -- met with grandsons at bedside and discussed condition from AML standpoint and answered their questions. No additional AML directed intervention at this time. If goals of care so suggest and the patient recover functional status from CVA and pneumonia standpoint then would consider f/u with [facility] for mx of AML.  Tried call the patients daughter Nanine Means at [phone] -- went directly to voice mail on several occasions - no VM box setup to leave call back message.  Sullivan Lone MD MS

## 2018-01-26 NOTE — Progress Notes (Signed)
OT Cancellation Note  Patient Details Name: Tommy Bentley MRN: [id number] DOB: Aug 18, 1951   Cancelled Treatment:    Reason Eval/Treat Not Completed: Pt with medical decline. Signing off. Please reorder as appropriate.  Malka So 01/26/2018, 9:17 AM

## 2018-01-26 NOTE — Progress Notes (Signed)
SLP Cancellation Note  Patient Details Name: Tommy Bentley MRN: [id number] DOB: Mar 28, 1951   Cancelled treatment:       Reason Eval/Treat Not Completed: Patient not medically ready (remains on vent)   Germain Osgood 01/26/2018, 7:53 AM  Germain Osgood, M.A. CCC-SLP [phone]

## 2018-01-26 NOTE — Progress Notes (Signed)
I had a discussion with the patient's grand-niece, Hjuel, who is the first point of contact. She was accompanied by her brother and two of the patient's nieces whom Hjuel translated for. We discussed his clinical status after his stroke, alveolar hemorrhage, and new diagnosis of AML as well as significant functional decline requiring mechanical ventilation for respiratory failure. I discussed that at this time, aggressive treatment for his AML (would require transfer to [facility] for inpatient Oncology services) would not be ideal given his current functional status and may cause further suffering.  I expressed that we are planning to continue mechanical ventilation at this time with continued weaning attempts and that once he is extubated, we would not recommend re-intubation if he again developed respiratory failure. Hjuel and her family agree to focus on quality of life and comfort at this time and have decided to change his code status to DNR. We will reassess weaning attempts in the AM and I have also consulted palliative care for further assistance in transitioning his care, for which the family is aware. - CODE STATUS: DNR - Continue vent support for now; plan for one-way extubation in the next 1-2 days - Palliative care consulted  Zada Finders, MD Internal Medicine PGY-3

## 2018-01-26 NOTE — Progress Notes (Signed)
PULMONARY / CRITICAL CARE MEDICINE   Name: Tommy Bentley MRN: [id number] DOB: 09-04-1951    ADMISSION DATE:  01/30/2018  REFERRING MD:  Dr. Shan Levans ([facility])  CHIEF COMPLAINT:  Aphasia, bloody stool, and right sided weakness  Brief patient summary: 67 y.o [place] male with unknown med hx presented with aphasia, right weakness. Imaging sig for bilateral mca infarcts and right pca infarct. MRA with right carotid siphon stenosis. LE venous doppler negative for dvt. Labs sig for hb=3.5, plt=40 got 5u on admission. GI following, plan is to do EGD once stable. Onc on board thinks maybe mantle cell lymphoma, aml, or apl. Flow cytometry results consistent with AML and pathology review showed Leukoerythroblastic smear with shistocytes and NRBC's. Kappa lamda free light chains elevated, ratio normal.  SUBJECTIVE:  Patient is sedated and intubated.  VITAL SIGNS: BP (!) 101/45   Pulse 76   Temp 97.9 F (36.6 C) (Oral)   Resp 15   Ht _0  (1.6 m)   Wt 98 lb 8.7 oz (44.7 kg)   SpO2 100%   BMI 17.46 kg/m   VENTILATOR SETTINGS: Vent Mode: PRVC FiO2 (%):  [40 %] 40 % Set Rate:  [16 bmp] 16 bmp Vt Set:  [340 mL] 340 mL PEEP:  [5 cmH20] 5 cmH20 Plateau Pressure:  [16 cmH20-18 cmH20] 17 cmH20  INTAKE / OUTPUT: I/O last 3 completed shifts: In: 2699 [I.V.:1869; Blood:630; IV Piggyback:200] Out: 2910 [Urine:2910]  PHYSICAL EXAMINATION: General: sedated HEENT: Intubated, PERRL Cardiac: RRR, no rubs, murmurs or gallops Pulm: coarse vent assisted breath sounds anteriorly Abd: soft, nondistended Ext: warm and well perfused, no pedal edema Neuro: sedated   LABS:  BMET Recent Labs  Lab 01/25/18 0431 01/25/18 1724 01/26/18 0419  NA 142 143 144  K 4.0 3.8 3.5  CL 111 112* 112*  CO2 20* 20* 21*  BUN 31* 31* 34*  CREATININE 1.63* 1.90* 2.18*  GLUCOSE 95 91 90    Electrolytes Recent Labs  Lab 01/25/18 0431 01/25/18 1724 01/26/18 0419  CALCIUM 7.4* 7.7* 8.0*  MG 2.2  --   --    PHOS 4.5  --   --     CBC Recent Labs  Lab 01/25/18 0858 01/25/18 2043 01/26/18 0419  WBC 12.1* 12.7* 10.7*  HGB 8.6* 8.7* 8.4*  HCT 28.0* 27.3* 26.4*  PLT 56* 48* 42*    Coag's Recent Labs  Lab 01/20/2018 1719 01/24/18 0455  APTT 42* 36  INR 1.30 1.32    Sepsis Markers Recent Labs  Lab 01/24/18 0955 01/24/18 1448 01/25/18 0431 01/26/18 0419  LATICACIDVEN 1.3 1.3  --   --   PROCALCITON 1.27  --  1.46 1.62    ABG Recent Labs  Lab 01/24/18 0427 01/25/18 0342 01/26/18 0322  PHART 7.347* 7.293* 7.306*  PCO2ART 40.8 40.7 45.3  PO2ART 318.0* 99.0 155*    Liver Enzymes Recent Labs  Lab 02/07/2018 1719 01/23/18 0205 01/24/18 0315  AST _1 ALT 10* 10* 11*  ALKPHOS 55 52 58  BILITOT 1.7* 1.5* 2.7*  ALBUMIN 2.5* 2.3* 2.4*    Cardiac Enzymes No results for input(s): TROPONINI, PROBNP in the last 168 hours.  Glucose Recent Labs  Lab 01/25/18 1112 01/25/18 1526 01/25/18 1956 01/25/18 2344 01/26/18 0346 01/26/18 0747  GLUCAP 79 90 92 84 82 77    Imaging Dg Chest Port 1 View  Result Date: 01/26/2018 CLINICAL DATA:  Shortness of breath EXAM: PORTABLE CHEST 1 VIEW COMPARISON:  Yesterday FINDINGS: Endotracheal tube tip  at the clavicular heads. Left IJ line with tip at the upper cavoatrial junction. Asymmetric central airspace disease to the right. There is partial clearing, especially on the left. Normal heart size and mediastinal contours. IMPRESSION: History of alveolar hemorrhage with partial radiographic clearing. Electronically Signed   By: Monte Fantasia M.D.   On: 01/26/2018 08:01      STUDIES:  MRI(4/7):  1. Numerous small foci of acute/early subacute infarction are present throughout the left hemisphere, largely in a watershed distribution. 2. Late subacute to chronic hemorrhagic infarct in right occipital lobe. 3. No acute hemorrhage, mass effect, or herniation. 4. Background of moderate chronic microvascular ischemic changes  and parenchymal volume loss of the brain.  CT head (4/7): Areas of low-density in the right occipital lobe and within the left hemispheric white matter worrisome for acute/subacute infarctions. No hemorrhage or mass effect.  MRA (4/7): 1. Left M2 superior division appears to have common origin with left A1 and is occluded proximally. 2. No additional large vessel occlusion of the intracranial circulation identified.  MRA neck: Severe motion artifact. Patent carotid systems and vertebral arteries. No definite evidence for hemodynamically significant stenosis by NASCET criteria.  CT abdomen/pelvis 4/7:negative for acute intra-abdominal processes   CXR 4/9:diffuse, bilateral airspace opacities, no focal consolidations or pleural effusions   CULTURES: RVP 4/9 - neg BAL 4/9 >>   ANTIBIOTICS: Azithro 4/7 Ceftriaxone 4/7 Cefepime 4/8 >> 4/9  SIGNIFICANT EVENTS: 4/9 > admitted to ICU + bronchoscopy + intubation  LINES/TUBES: PIV LUE x2 and RUE ETT CVC LIJ  ASSESSMENT / PLAN:  PULMONARY A: Acute hypoxic respiratory failure - CXR with diffuse airspace disease, concern for blast crisis vs DAH vs TRALI  Bilateral pulmonary infiltrates P: Continue Vent support - wean and SBT as able Bronchoscopy with serial aliquots of 60cc saline done. Petichiae on bronchial wall without edema. Culture shows no growth.  CARDIOVASCULAR A: Tachycardia - likely secondary to hypoxia - improved HFpEF -(01/23/18) EF 55%, G2DD, mild MR P: MAP goal > 65 S/p Lasix 4/10 with good UOP Monitor volume status   RENAL A: Hyponatremia - resolved AKI-likely prerenal from poor po intake +/- lasix diuresis P: Monitor BMET/UOP/I&Os  GASTROINTESTINAL A: Upper GI bleed - stabilized P: EGD on hold per GI Monitor Hgb, signs/symptoms of further bleeding NPO Octreotide gtt  Protonix gtt  HEMATOLOGIC A: AML Microcytic anemia s/p 5u pRBCs and plt in the 40s.   Leukoerythroblastic reaction Thrombocytopenia P: Monitor CBC and CMP If platelets<50,000 and active bleeding or if platelets<20,000 then transfuse platelets May need transfer to [facility] for AML treatment per Heme/Onc  INFECTIOUS A: Immunocompromised in the setting of likely hematological malignancy P: RVP negative  BAL (4/9) no growth   ENDOCRINE A: No acute issue P: - NPO  - CBG goal < 180; continue to monitor  NEUROLOGIC A: L + R occipital lobe hemorrhagic strokes - no tPA given, Neuro deficits include R facial droop, RUE weakness, and inability to follow commands. Sedation P: Appreciate neurology following- holding antiplatelets and embolic workup in the setting of severe thrombocytopenia RASS goal:-1 - -2 - Stroke workup: HgbA1c<4.2,LDL 44, TTE EF 55%, G2DD, mild MR, lower extremity doppler did not show any presence of dvt - Propofol/Fentanyl  FAMILY - Updates: Per Dr. Maricela Bo: Family at bedside (4/10) updated about clinical status and treatment plan. They wish to pursue hospice/palliative care if aggressive therapy is required.   Zada Finders, MD Internal Medicine PGY-3  01/26/2018, 9:56 AM

## 2018-01-26 NOTE — Progress Notes (Signed)
Physical Therapy Discharge Patient Details Name: Tommy Bentley MRN: [id number] DOB: [birth year] Today's Date: 01/26/2018 Time:  -     Patient discharged from PT services secondary to medical decline - will need to re-order PT to resume therapy services.  Please see latest therapy progress note for current level of functioning and progress toward goals.    Progress and discharge plan discussed with patient and/or caregiver: Patient unable to participate in discharge planning and no caregivers available  GP     Denice Paradise 01/26/2018, 9:15 AM  [facility] [phone] (pager)

## 2018-01-27 ENCOUNTER — Inpatient Hospital Stay (HOSPITAL_COMMUNITY): Payer: Medicare Other

## 2018-01-27 DIAGNOSIS — Z515 Encounter for palliative care: Secondary | ICD-10-CM

## 2018-01-27 DIAGNOSIS — Z7189 Other specified counseling: Secondary | ICD-10-CM

## 2018-01-27 DIAGNOSIS — Z978 Presence of other specified devices: Secondary | ICD-10-CM

## 2018-01-27 DIAGNOSIS — R042 Hemoptysis: Secondary | ICD-10-CM

## 2018-01-27 LAB — TRIGLYCERIDES: TRIGLYCERIDES: 105 mg/dL (ref <150)

## 2018-01-27 LAB — CBC
HEMATOCRIT: 24.6 % — AB (ref 39.0–52.0)
Hemoglobin: 7.8 g/dL — ABNORMAL LOW (ref 13.0–17.0)
MCH: 27 pg (ref 26.0–34.0)
MCHC: 31.7 g/dL (ref 30.0–36.0)
MCV: 85.1 fL (ref 78.0–100.0)
PLATELETS: 25 10*3/uL — AB (ref 150–400)
RBC: 2.89 MIL/uL — ABNORMAL LOW (ref 4.22–5.81)
RDW: 22.8 % — AB (ref 11.5–15.5)
WBC: 10.2 10*3/uL (ref 4.0–10.5)

## 2018-01-27 LAB — RENAL FUNCTION PANEL
ALBUMIN: 1.6 g/dL — AB (ref 3.5–5.0)
Anion gap: 6 (ref 5–15)
BUN: 30 mg/dL — ABNORMAL HIGH (ref 6–20)
CHLORIDE: 112 mmol/L — AB (ref 101–111)
CO2: 22 mmol/L (ref 22–32)
CREATININE: 1.75 mg/dL — AB (ref 0.61–1.24)
Calcium: 7.6 mg/dL — ABNORMAL LOW (ref 8.9–10.3)
GFR calc Af Amer: 45 mL/min — ABNORMAL LOW (ref >60)
GFR calc non Af Amer: 39 mL/min — ABNORMAL LOW (ref >60)
GLUCOSE: 105 mg/dL — AB (ref 65–99)
PHOSPHORUS: 2.6 mg/dL (ref 2.5–4.6)
POTASSIUM: 3.5 mmol/L (ref 3.5–5.1)
Sodium: 140 mmol/L (ref 135–145)

## 2018-01-27 LAB — MAGNESIUM
Magnesium: 2.2 mg/dL (ref 1.7–2.4)
Magnesium: 2.1 mg/dL (ref 1.7–2.4)
Magnesium: 2 mg/dL (ref 1.7–2.4)

## 2018-01-27 LAB — GLUCOSE, CAPILLARY
GLUCOSE-CAPILLARY: 87 mg/dL (ref 65–99)
GLUCOSE-CAPILLARY: 130 mg/dL — AB (ref 65–99)
GLUCOSE-CAPILLARY: 107 mg/dL — AB (ref 65–99)
Glucose-Capillary: 108 mg/dL — ABNORMAL HIGH (ref 65–99)
Glucose-Capillary: 131 mg/dL — ABNORMAL HIGH (ref 65–99)
Glucose-Capillary: 138 mg/dL — ABNORMAL HIGH (ref 65–99)

## 2018-01-27 LAB — PHOSPHORUS
PHOSPHORUS: 2.5 mg/dL (ref 2.5–4.6)
Phosphorus: 2.2 mg/dL — ABNORMAL LOW (ref 2.5–4.6)

## 2018-01-27 MED ORDER — VITAL HIGH PROTEIN PO LIQD
1000.0000 mL | ORAL | Status: DC
  Administered 2018-01-27: 1000 mL

## 2018-01-27 MED ORDER — DEXMEDETOMIDINE HCL IN NACL 400 MCG/100ML IV SOLN
0.4000 ug/kg/h | INTRAVENOUS | Status: DC
  Administered 2018-01-27: 1 ug/kg/h via INTRAVENOUS
  Administered 2018-01-27: 0.4 ug/kg/h via INTRAVENOUS
  Administered 2018-01-28: 1 ug/kg/h via INTRAVENOUS
  Administered 2018-01-28 (×2): 1.2 ug/kg/h via INTRAVENOUS
  Administered 2018-01-29: 1.6 ug/kg/h via INTRAVENOUS
  Administered 2018-01-29: 1.2 ug/kg/h via INTRAVENOUS
  Administered 2018-01-30 – 2018-02-01 (×9): 1.6 ug/kg/h via INTRAVENOUS
  Filled 2018-01-27 (×23): qty 100

## 2018-01-27 MED ORDER — SODIUM CHLORIDE 0.9% FLUSH
10.0000 mL | INTRAVENOUS | Status: DC | PRN
  Administered 2018-02-01: 10 mL
  Filled 2018-01-27: qty 40

## 2018-01-27 MED ORDER — PRO-STAT SUGAR FREE PO LIQD
30.0000 mL | Freq: Two times a day (BID) | ORAL | Status: DC
  Administered 2018-01-27: 30 mL
  Filled 2018-01-27: qty 30

## 2018-01-27 MED ORDER — SODIUM CHLORIDE 0.9% FLUSH
10.0000 mL | Freq: Two times a day (BID) | INTRAVENOUS | Status: DC
  Administered 2018-01-27 – 2018-02-02 (×10): 10 mL

## 2018-01-27 MED ORDER — FENTANYL CITRATE (PF) 100 MCG/2ML IJ SOLN
12.5000 ug | INTRAMUSCULAR | Status: DC | PRN
  Administered 2018-01-28 – 2018-01-29 (×2): 25 ug via INTRAVENOUS
  Filled 2018-01-27 (×2): qty 2

## 2018-01-27 MED ORDER — CHLORHEXIDINE GLUCONATE CLOTH 2 % EX PADS: 6.0000 | MEDICATED_PAD | Freq: Every day | CUTANEOUS | Status: DC

## 2018-01-27 MED ORDER — VITAL AF 1.2 CAL PO LIQD
1000.0000 mL | ORAL | Status: DC
  Administered 2018-01-27 – 2018-01-31 (×5): 1000 mL

## 2018-01-27 MED ORDER — SODIUM CHLORIDE 0.9 % IV SOLN
INTRAVENOUS | Status: DC
  Administered 2018-01-28: 13:00:00 via INTRAVENOUS

## 2018-01-27 NOTE — Progress Notes (Signed)
CSW continuing to follow for further placement needs for pt at the time of discharge. CSW aware that pt remains on vent at 40 percent at this time. CSW will remain involved for support to family as well as pt.     Virgie Dad Raneem Mendolia, MSW, [facility] Clinical Social Worker [phone]

## 2018-01-27 NOTE — Progress Notes (Signed)
SLP Cancellation Note  Patient Details Name: Tommy Bentley MRN: [id number] DOB: 01/02/1951   Cancelled treatment:       Reason Eval/Treat Not Completed: Medical issues which prohibited therapy  The patient is currently intubated and not weaning per nursing.  Family meeting tomorrow to determine goals of care.  ST will follow up on Monday to determine if ST involvement is still desired.    Shelly Flatten, MA, [facility] SLP [phone]  Lamar Sprinkles 01/27/2018, 10:17 AM

## 2018-01-27 NOTE — Progress Notes (Signed)
PULMONARY / CRITICAL CARE MEDICINE   Name: Tommy Bentley MRN: [id number] DOB: [birth year]    ADMISSION DATE:  02/08/2018  REFERRING MD:  Dr. Shan Levans ([facility])  CHIEF COMPLAINT:  Aphasia, bloody stool, and right sided weakness  Brief patient summary: 67 y.o [place] male with unknown med hx presented with aphasia, right weakness. Imaging sig for bilateral mca infarcts and right pca infarct. MRA with right carotid siphon stenosis. LE venous doppler negative for dvt. Labs sig for hb=3.5, plt=40 got 5u on admission. GI following, plan is to do EGD once stable. Onc on board thinks maybe mantle cell lymphoma, aml, or apl. Flow cytometry results consistent with AML and pathology review showed Leukoerythroblastic smear with shistocytes and NRBC's. Kappa lamda free light chains elevated, ratio normal.   STUDIES:  MRI(4/7):  1. Numerous small foci of acute/early subacute infarction are present throughout the left hemisphere, largely in a watershed distribution. 2. Late subacute to chronic hemorrhagic infarct in right occipital lobe. 3. No acute hemorrhage, mass effect, or herniation. 4. Background of moderate chronic microvascular ischemic changes and parenchymal volume loss of the brain.  CT head (4/7): Areas of low-density in the right occipital lobe and within the left hemispheric white matter worrisome for acute/subacute infarctions. No hemorrhage or mass effect.  MRA (4/7): 1. Left M2 superior division appears to have common origin with left A1 and is occluded proximally. 2. No additional large vessel occlusion of the intracranial circulation identified.  MRA neck: Severe motion artifact. Patent carotid systems and vertebral arteries. No definite evidence for hemodynamically significant stenosis by NASCET criteria.  CT abdomen/pelvis 4/7:negative for acute intra-abdominal processes   CXR 4/9:diffuse, bilateral airspace opacities, no focal consolidations or pleural effusions    CULTURES: RVP 4/9 - neg BAL 4/9 >> no growth  ANTIBIOTICS: Azithro 4/7 Ceftriaxone 4/7 Cefepime 4/8 >> 4/9  SIGNIFICANT EVENTS: 4/7 > admitted to [facility] with Lt sided CVA, Hgb 3.9 - transfused 4 units PRBCs 4/9 > admitted to ICU + bronchoscopy + intubation 4/11 > Code status changed to DNR  LINES/TUBES: PIV LUE ETT 4/9 CVC LIJ4/9  SUBJECTIVE:  Patient is sedated and intubated. Family decided to change code status to DNR on 4/11.  VITAL SIGNS: BP (!) 108/48   Pulse 75   Temp 98.9 F (37.2 C) (Oral)   Resp 20   Ht _0  (1.6 m)   Wt 98 lb 15.8 oz (44.9 kg)   SpO2 99%   BMI 17.53 kg/m   VENTILATOR SETTINGS: Vent Mode: PRVC FiO2 (%):  [40 %] 40 % Set Rate:  [16 bmp] 16 bmp Vt Set:  [340 mL] 340 mL PEEP:  [5 cmH20] 5 cmH20 Plateau Pressure:  [11 cmH20-21 cmH20] 14 cmH20  INTAKE / OUTPUT: I/O last 3 completed shifts: In: 1968.3 [I.V.:1568.3; IV Piggyback:400] Out: 2060 [Urine:2060]  PHYSICAL EXAMINATION: General: sedated HEENT: Intubated, PERRL Cardiac: RRR, no rubs, murmurs or gallops Pulm: coarse vent assisted breath sounds anteriorly Abd: soft, nondistended Ext: no pedal edema Neuro: sedated   LABS:  BMET Recent Labs  Lab 01/25/18 1724 01/26/18 0419 01/27/18 0316  NA 143 144 140  K 3.8 3.5 3.5  CL 112* 112* 112*  CO2 20* 21* 22  BUN 31* 34* 30*  CREATININE 1.90* 2.18* 1.75*  GLUCOSE 91 90 105*    Electrolytes Recent Labs  Lab 01/25/18 0431 01/25/18 1724 01/26/18 0419 01/27/18 0316  CALCIUM 7.4* 7.7* 8.0* 7.6*  MG 2.2  --   --  2.2  PHOS 4.5  --   --  2.6    CBC Recent Labs  Lab 01/25/18 2043 01/26/18 0419 01/27/18 0316  WBC 12.7* 10.7* 10.2  HGB 8.7* 8.4* 7.8*  HCT 27.3* 26.4* 24.6*  PLT 48* 42* 25*    Coag's Recent Labs  Lab 02/01/2018 1719 01/24/18 0455  APTT 42* 36  INR 1.30 1.32    Sepsis Markers Recent Labs  Lab 01/24/18 0955 01/24/18 1448 01/25/18 0431 01/26/18 0419  LATICACIDVEN 1.3 1.3  --   --    PROCALCITON 1.27  --  1.46 1.62    ABG Recent Labs  Lab 01/24/18 0427 01/25/18 0342 01/26/18 0322  PHART 7.347* 7.293* 7.306*  PCO2ART 40.8 40.7 45.3  PO2ART 318.0* 99.0 155*    Liver Enzymes Recent Labs  Lab 02/11/2018 1719 01/23/18 0205 01/24/18 0315 01/27/18 0316  AST _0 --   ALT 10* 10* 11*  --   ALKPHOS 55 52 58  --   BILITOT 1.7* 1.5* 2.7*  --   ALBUMIN 2.5* 2.3* 2.4* 1.6*    Cardiac Enzymes No results for input(s): TROPONINI, PROBNP in the last 168 hours.  Glucose Recent Labs  Lab 01/26/18 0747 01/26/18 1149 01/26/18 1519 01/26/18 2017 01/27/18 0008 01/27/18 0357  GLUCAP 77 76 137* 149* 131* 87    Imaging Dg Chest Port 1 View  Result Date: 01/27/2018 CLINICAL DATA:  Evaluate endotracheal tube EXAM: PORTABLE CHEST 1 VIEW COMPARISON:  01/26/2018 FINDINGS: Endotracheal tube and left jugular central line are again noted and stable. Patchy infiltrates are again identified bilaterally right greater than left. Cardiac shadow is stable. No pneumothorax is noted. No bony abnormality is seen. IMPRESSION: Stable bilateral infiltrates right greater than left. Electronically Signed   By: Inez Catalina M.D.   On: 01/27/2018 07:26     ASSESSMENT / PLAN:  PULMONARY A: Acute hypoxic respiratory failure - CXR with diffuse airspace disease, concern for blast crisis vs DAH vs TRALI  Bilateral pulmonary infiltrates Bronch 4/9 suggestive of pulmonary alveolar hemorrhage P: Vent support - wean/SBT - will be one way extubation  CARDIOVASCULAR A: Tachycardia - likely secondary to hypoxia - improved HFpEF -(01/23/18) EF 55%, G2DD, mild MR P: MAP goal > 65 Monitor volume status   RENAL A: Hyponatremia - resolved AKI-likely prerenal from poor po intake +/- lasix diuresis P: Monitor BMET/UOP/I&Os  GASTROINTESTINAL A: ? Upper GI bleed - stabilized P: EGD on hold per GI Monitor Hgb, signs/symptoms of further  bleeding NPO  HEMATOLOGIC A: AML Microcytic anemia s/p 5u pRBCs and plt in the 40s.  Leukoerythroblastic reaction Thrombocytopenia P: Monitor CBC and CMP If platelets<50,000 and active bleeding or if platelets<20,000 then transfuse platelets May need transfer to [facility] for AML treatment per Heme/Onc  INFECTIOUS A: Immunocompromised in the setting of likely hematological malignancy P: RVP negative  BAL (4/9) no growth   ENDOCRINE A: Hypoglycemia P: - NPO  - CBG goal < 180; continue to monitor - D10 @ 50/hr  NEUROLOGIC A: L + R occipital lobe hemorrhagic strokes - no tPA given, Neuro deficits include R facial droop, RUE weakness, and inability to follow commands. Sedation P: -Holding antiplatelets and embolic workup in the setting of severe thrombocytopenia RASS goal:0, -1 - Propofol/Fentanyl, can use Precedex during weans for agitation  FAMILY - Updates: Family updated 4/11 - Code status updated to DNR. Plan for one-way extubation when ready. Family would like to be notified prior to extubation.  Zada Finders, MD Internal Medicine PGY-3  01/27/2018, 8:40 AM

## 2018-01-27 NOTE — Progress Notes (Signed)
CRITICAL VALUE ALERT  Critical Value: Platelet 25  Date & Time Notied: 01/27/18 0407  Provider Notified: Dr. Charlynn Grimes  Orders Received/Actions taken: No new orders given at this time

## 2018-01-27 NOTE — Progress Notes (Signed)
Nutrition Follow-up / Consult  DOCUMENTATION CODES:   Severe malnutrition in context of acute illness/injury  INTERVENTION:   Start TF after Cortrak tube placed:   Vital AF 1.2 at 45 ml/h (1080 ml per day)   Provides 1296 kcal, 81 gm protein, 876 ml free water daily  NUTRITION DIAGNOSIS:   Severe Malnutrition related to acute illness, lethargy/confusion(CVA) as evidenced by mild fat depletion, moderate fat depletion, moderate muscle depletion, severe muscle depletion.  Ongoing  GOAL:   Patient will meet greater than or equal to 90% of their needs  TF being initiated today  MONITOR:   Vent status, TF tolerance, Labs, I & O's  REASON FOR ASSESSMENT:   Consult Enteral/tube feeding initiation and management  ASSESSMENT:   67 year old male with PMH of occasional alcohol use and regular tobacco use who presented to ED with slurred speech, weakness, and difficulty with balance. CT showed acute infarct in R occipital lobe.  Discussed patient in ICU rounds and with RN today. Per CCM MD, plans to place Cortrak tube today and initiate TF. Neurology also following. Palliative Care team to meet with family tomorrow to discuss goals of care.  Patient is currently intubated on ventilator support MV: 6.8 L/min Temp (24hrs), Avg:98.4 F (36.9 C), Min:97.6 F (36.4 C), Max:99.1 F (37.3 C)  Propofol: 2.7 ml/hr providing 71 kcal from lipid. Labs reviewed. Phosphorus 2.2 (L) CBG's: [phone] Medications reviewed and include propofol, thiamine, folic acid.  Diet Order:  Diet NPO time specified  EDUCATION NEEDS:   Not appropriate for education at this time  Skin:  Skin Assessment: Reviewed RN Assessment  Last BM:  4/9 (type 6)  Height:   Ht Readings from Last 1 Encounters:  01/16/2018 5\' 3"  (1.6 m)    Weight:   Wt Readings from Last 1 Encounters:  01/27/18 98 lb 15.8 oz (44.9 kg)    Ideal Body Weight:  56.4 kg  BMI:  Body mass index is 17.53 kg/m.  Estimated  Nutritional Needs:   Kcal:  1310  Protein:  80-90 gm  Fluid:  1.5-1.7 L/day    Molli Barrows, RD, LDN, [place] Pager [phone] After Hours Pager [phone]

## 2018-01-27 NOTE — Progress Notes (Addendum)
STROKE TEAM PROGRESS NOTE   SUBJECTIVE (INTERVAL HISTORY) [name] is at bedside.  Pt medical condition no significant change, neuro unchanged. Patient eyes open on naming calling, still intubated, but agitated, trying to get up from bed, with tachycardia and hypertension, plan to change propofol and fentanyl to precedex. Family meeting with palliative care tomorrow noon. Anemia and thrombocytopenia worsening.     OBJECTIVE Vitals:   01/27/18 1100 01/27/18 1105 01/27/18 1123 01/27/18 1200  BP: (!) 103/49 (!) 103/49  (!) 167/72  Pulse: 74 74  (!) 109  Resp: '14 17  16  ' Temp:   98.8 F (37.1 C)   TempSrc:   Oral   SpO2: 99% 98%  100%  Weight:      Height:        CBC:  Recent Labs  Lab 02/04/2018 1719  01/26/18 0419 01/27/18 0316  WBC 12.6*   < > 10.7* 10.2  NEUTROABS 1.4*  --   --   --   HGB 3.5*   < > 8.4* 7.8*  HCT 12.4*   < > 26.4* 24.6*  MCV 78.5   < > 84.9 85.1  PLT 43*   < > 42* 25*   < > = values in this interval not displayed.    Basic Metabolic Panel:  Recent Labs  Lab 01/26/18 0419 01/27/18 0316 01/27/18 1124  NA 144 140  --   K 3.5 3.5  --   CL 112* 112*  --   CO2 21* 22  --   GLUCOSE 90 105*  --   BUN 34* 30*  --   CREATININE 2.18* 1.75*  --   CALCIUM 8.0* 7.6*  --   MG  --  2.2 2.1  PHOS  --  2.6 2.2*    Lipid Panel:     Component Value Date/Time   CHOL 73 01/23/2018 0029   TRIG 105 01/27/2018 0316   HDL 14 (L) 01/23/2018 0029   CHOLHDL 5.2 01/23/2018 0029   VLDL 15 01/23/2018 0029   LDLCALC 44 01/23/2018 0029   HgbA1c:  Lab Results  Component Value Date   HGBA1C <4.2 (L) 01/23/2018   Urine Drug Screen: No results found for: LABOPIA, COCAINSCRNUR, LABBENZ, AMPHETMU, THCU, LABBARB  Alcohol Level No results found for: [name] I have personally reviewed the radiological images below and agree with the radiology interpretations.  Ct Head Wo Contrast 01/27/2018 Areas of low-density in the right occipital lobe and within the  left hemispheric white matter worrisome for acute/subacute infarctions. No hemorrhage or mass effect. The infarctions could be embolic or watershed. Electronically Signed   By: Tommy Bentley M.D.   On: 01/18/2018 16:08   Mr Brain Wo Contrast 01/23/2018 1. Numerous small foci of acute/early subacute infarction are present throughout the left hemisphere, largely in a watershed distribution. 2. Late subacute to chronic hemorrhagic infarct in right occipital lobe. 3. No acute hemorrhage, mass effect, or herniation. 4. Background of moderate chronic microvascular ischemic changes and parenchymal volume loss of the brain.   Mr Tommy Bentley Head Wo Contrast 01/23/2018  1. Left M2 superior division appears to have common origin with left A1 and is occluded proximally. 2. No additional large vessel occlusion of the intracranial circulation identified. MRA neck: Severe motion artifact. Patent carotid systems and vertebral arteries. No definite evidence for hemodynamically significant stenosis by NASCET criteria.   Mr Tommy Bentley Neck Wo Contrast 01/23/2018 Severe motion artifact. Patent carotid systems and vertebral arteries. No definite evidence for hemodynamically  significant stenosis by NASCET criteria.   Ct Abdomen Pelvis W Contrast 01/29/2018  1. Small foci of consolidation in the right middle and lower lobe may represent pneumonia. 2. Severe calcific atherosclerosis of abdominal aorta and iliofemoral arteries. 3. Moderate prostate enlargement. 4. No acute process of abdomen or pelvis identified.   Lower Extremity Venous Dopplers  pending  2D Echocardiogram  EF 55-60% with no source of embolus.    PHYSICAL EXAM  Temp:  [97.6 F (36.4 C)-99.1 F (37.3 C)] 98.8 F (37.1 C) (04/12 1123) Pulse Rate:  [74-109] 109 (04/12 1200) Resp:  [11-23] 16 (04/12 1200) BP: (96-167)/(41-72) 167/72 (04/12 1200) SpO2:  [96 %-100 %] 100 % (04/12 1200) FiO2 (%):  [40 %] 40 % (04/12 1105) Weight:  [98 lb 15.8 oz (44.9 kg)] 98 lb 15.8  oz (44.9 kg) (04/12 0221)  General - cachectic, well developed, intubated with light sedation but agitated.  Ophthalmologic - Fundi not visualized due to noncooperation  Cardiovascular - Regular rhythm, but tachycardia.  Neuro - intubated, on light sedation or naming calling he became agitated, eyes open, not following commands.  Left gaze preference with staring, barely able to cross midline, blinking to visual threat on the left, but not right. PERRL.  Facial asymmetry not able to test due to ET tube. LUE and LLE actively movement against gravity. RUE proximal 3-/5 and distal 0/5 and RLE withdraw 2/5 on pain. DTR 1+ in no Babinski. Sensation, coordination and gait not tested.   ASSESSMENT/PLAN Tommy Bentley is a 67 y.o. male who has not seen a physician in years with a known hx of smoking (stopped 14 days ago) and reported occasional ETOH use presenting following flu 2 weeks ago with a 2 week history of unsteady gait, slurred speech, decreased speech output, R facial, and R arm weakness. Also with bowel & bladder incontinence, hgb 3.5, Cr 1.48, alb 1.5, Na 131.  Stroke:  Acute left MCA infarct in setting of L M2 occlusion. Infarcts likely due to severe anemia in the setting of left M2 occlusion and right carotid siphon stenosis, although cardioembolic still in DDx  Resultant - agitated on vent with light sedation, not following commands  CT head ow density right occipital lobe and left hemispheric white matter  MRI head numerous left brain embolic infarcts. Subacute right hemorrhagic infarcts. Small vessel disease. Atrophy.   MRA head L M2 occlusion, right carotid siphon stenosis  MRA neck motion degraded. large arteries appear patent  2D Echo  EF 55-60%. No source of embolus   Lower extremity Dopplers no DVT  LDL 44  HgbA1c < 4.2  SCDs for VTE prophylaxis Diet NPO time specified  No antithrombotic prior to admission, now on No antithrombotic given severe anemia and possible GI  bleed and thrombocytopenia  Ongoing aggressive stroke risk factor management  Therapy recommendations:  pending  Disposition:  Pending  Embolic workup and treatment on hold given anemia and thrombocytopenia as well as medical complexity.   Given complex respiratory failure, neuro deficit and hematological disorder, agree to involve palliative care tomorrow for goal of care discussion  Respiratory failure  Intubated for airway protection  On ventilation  CCM on board  Agitated on low dose sedation   Plan for precedex today  PVD and intracranial stenosis  Smoking likely to be the risk factor.   CT abdomen and pelvis showed severe atherosclerosis AA and iliofemoral arteries.  MRA head also showed left M2 occlusion and right carotid siphon stenosis.  Likely the  cause for current stroke  Severe anemia and thrombocytopenia  S/p blood transfusion  Hemoglobin 8.4-7.9-8.8-8.6-8.4-7.8  Platelet 43-35-44-56-42-25  History of melena   GI on board, EGD on hold  Hematology on board, concerning for lymphoma versus AML  Oncology recommend bone marrow biopsy if pt recovers from current conditions  Hold off anticoagulation and antiplatelet at this time  AKI  Creatinine 1.48->2.18-1.75  Worsening kidney function  Other Stroke Risk Factors  Advanced age  Cigarette smoker  Alcohol use, stopped 6 months ago  Other Active Problems   PNA - aspiration vs CAP. On abx  Malnutrition alb 2.3  leukocytosis  Given serious condition with respiratory failure, neuro deficit, hematological condition, worsening kidney function, agree with palliative care involvement tomorrow for goal of care discussion.   Neurology will follow peripherally. Please call with questions. Thanks for the consult.   Rosalin Hawking, MD PhD Stroke Neurology 01/27/2018 1:01 PM    To contact Stroke Continuity provider, please refer to [email or web address] After hours, contact General Neurology

## 2018-01-27 NOTE — Progress Notes (Signed)
Cortrak Tube Team Note:  Consult received to place a Cortrak feeding tube.   A 10 F Cortrak tube was placed in the left nare and secured with a nasal bridle at 60 cm. Per the Cortrak monitor reading the tube tip is gastric.   No x-ray is required. RN may begin using tube.   If the tube becomes dislodged please keep the tube and contact the Cortrak team at [email or web address] ([email or web address]) for replacement.  If after hours and replacement cannot be delayed, place a NG tube and confirm placement with an abdominal x-ray.    Mariana Single RD, LDN Clinical Nutrition Pager # [phone]

## 2018-01-27 NOTE — Consult Note (Signed)
Consultation Note Date: 01/27/2018   Patient Name: Tommy Bentley  DOB: [birth year]  MRN: [id number]  Age / Sex: 67 y.o., male  PCP: Patient, No Pcp Per Referring Physician: Brand Males, MD  Reason for Consultation: Establishing goals of care, Psychosocial/spiritual support and Withdrawal of life-sustaining treatment  HPI/Patient Profile: 67 y.o. male  with little known past medical history admitted on 02/03/2018 with right-sided weakness, fatigue and aphasia.  He was found to have had an acute left MCA infarct as well as subacute right hemorrhagic infarcts.  Patient had had the flu 3 weeks prior to admission.  Upon admission he was found to have a critically low hemoglobin of 3.5, platelets 43 and received transfusion.  Patient has been seen by oncological services secondary to profound anemia, thrombocytopenia and lymphocytosis and concern is now acute myelogenous leukemia  Consult ordered for goals of care and potentially withdrawal of life support.   Clinical Assessment and Goals of Care: Patient seen, chart reviewed.  Patient's great niece, Waverly Chavarria, is at the bedside.  She shares with me her mother, [name], is his primary source of support and decision maker.  Patient has never been married and has no children.  He has multiple family members that are supportive of him including nieces great nieces who view him as father and grandfather.  A decision was made on 01/26/2018 to make patient a DNR  [name] ( niece) is his health care proxy.   Patient has been in the [place] for 20+ years.  His great niece shares with me that she believes he has brothers that are living in [place] but is not sure    SUMMARY OF RECOMMENDATIONS   Continue DNR Follow-up family meeting scheduled for 01/28/2018 at 22 AM with patient's nieces and other relevant family members for further goals of care discussion including  extubation Code Status/Advance Care Planning:  DNR    Symptom Management:   Agitation: Continue with Precedex and propofol as directed by CCM  Dyspnea: Continue with fentanyl gtt  Pain: Continue with fentanyl infusion  Palliative Prophylaxis:   Aspiration, Bowel Regimen, Delirium Protocol, Eye Care, Frequent Pain Assessment, Oral Care and Turn Reposition  Additional Recommendations (Limitations, Scope, Preferences):  Full Scope Treatment except for DNR  Psycho-social/Spiritual:   Desire for further Chaplaincy support:no  Additional Recommendations: Grief/Bereavement Support  Prognosis:   Unable to determine  Discharge Planning: To Be Determined      Primary Diagnoses: Present on Admission: **None**   I have reviewed the medical record, interviewed the patient and family, and examined the patient. The following aspects are pertinent.  Past Medical History:  Diagnosis Date  . Hard of hearing    Social History   Socioeconomic History  . Marital status: Married    Spouse name: Not on file  . Number of children: Not on file  . Years of education: Not on file  . Highest education level: Not on file  Occupational History  . Not on file  Social Needs  . Financial  resource strain: Not on file  . Food insecurity:    Worry: Not on file    Inability: Not on file  . Transportation needs:    Medical: Not on file    Non-medical: Not on file  Tobacco Use  . Smoking status: Current Every Day Smoker    Packs/day: 0.33  . Smokeless tobacco: Never Used  Substance and Sexual Activity  . Alcohol use: Yes    Comment: Occasional  . Drug use: No  . Sexual activity: Not on file  Lifestyle  . Physical activity:    Days per week: Not on file    Minutes per session: Not on file  . Stress: Not on file  Relationships  . Social connections:    Talks on phone: Not on file    Gets together: Not on file    Attends religious service: Not on file    Active member of  club or organization: Not on file    Attends meetings of clubs or organizations: Not on file    Relationship status: Not on file  Other Topics Concern  . Not on file  Social History Narrative  . Not on file   No family history on file. Scheduled Meds: . chlorhexidine gluconate (MEDLINE KIT)  15 mL Mouth Rinse BID  . feeding supplement (PRO-STAT SUGAR FREE 64)  30 mL Per Tube BID  . feeding supplement (VITAL HIGH PROTEIN)  1,000 mL Per Tube Q24H  . folic acid  1 mg Intravenous Daily  . insulin aspart  0-9 Units Subcutaneous Q4H  . mouth rinse  15 mL Mouth Rinse 10 times per day  . pantoprazole (PROTONIX) IV  40 mg Intravenous Daily  . sodium chloride flush  3 mL Intravenous Q12H  . thiamine  100 mg Intravenous Daily   Continuous Infusions: . dexmedetomidine 0.4 mcg/kg/hr (01/27/18 1132)  . dextrose 50 mL/hr at 01/27/18 0558  . fentaNYL infusion INTRAVENOUS 100 mcg/hr (01/26/18 1814)  . propofol (DIPRIVAN) infusion 10 mcg/kg/min (01/27/18 0558)   PRN Meds:.acetaminophen **OR** acetaminophen, fentaNYL, fentaNYL (SUBLIMAZE) injection, ipratropium-albuterol, midazolam, midazolam, ondansetron **OR** ondansetron (ZOFRAN) IV, senna-docusate Medications Prior to Admission:  Prior to Admission medications   Medication Sig Start Date End Date Taking? Authorizing Provider  diclofenac (VOLTAREN) 50 MG EC tablet Take 1 tablet (50 mg total) by mouth 3 (three) times daily as needed for moderate pain. Patient not taking: Reported on 01/17/2018 09/17/15   Melony Overly, MD  Diclofenac Sodium & Capsaicin 1.5 & 0.025 % THPK Apply 1 application topically 2 (two) times daily. Patient not taking: Reported on 02/01/2018 04/21/16   Konrad Felix, PA  gabapentin (NEURONTIN) 300 MG capsule Take 1 capsule (300 mg total) by mouth at bedtime. Patient not taking: Reported on 02/12/2018 09/17/15   Melony Overly, MD  predniSONE (DELTASONE) 50 MG tablet Take 1 pill daily for 5 days. Patient not taking: Reported on  01/17/2018 09/17/15   Melony Overly, MD   No Known Allergies Review of Systems  Unable to perform ROS: Intubated    Physical Exam  Constitutional:  Frail appearing, acutely ill older [place] man seen in ICU; he is intubated  HENT:  Head: Normocephalic and atraumatic.  Cardiovascular: Normal rate.  Pulmonary/Chest:  Intubated  Genitourinary:  Genitourinary Comments: Foley  Neurological:  Unresponsive  Skin: Skin is warm and dry.  Psychiatric:  No overt agitation noted; he is on Precedex and propofol and a fentanyl infusion otherwise unable to test  Nursing note and  vitals reviewed.   Vital Signs: BP (!) 103/49   Pulse 74   Temp 98.8 F (37.1 C) (Oral)   Resp 17   Ht '5\' 3"'  (1.6 m)   Wt 44.9 kg (98 lb 15.8 oz)   SpO2 98%   BMI 17.53 kg/m  Pain Scale: CPOT   Pain Score: Asleep   SpO2: SpO2: 98 % O2 Device:SpO2: 98 % O2 Flow Rate: .O2 Flow Rate (L/min): 15 L/min  IO: Intake/output summary:   Intake/Output Summary (Last 24 hours) at 01/27/2018 1149 Last data filed at 01/27/2018 0948 Gross per 24 hour  Intake 1290.06 ml  Output 735 ml  Net 555.06 ml    LBM: Last BM Date: 01/24/18 Baseline Weight: Weight: 59 kg (130 lb) Most recent weight: Weight: 44.9 kg (98 lb 15.8 oz)     Palliative Assessment/Data:   Flowsheet Rows     Most Recent Value  Intake Tab  Referral Department  Critical care  Unit at Time of Referral  ICU  Palliative Care Primary Diagnosis  Neurology  Date Notified  01/26/18  Palliative Care Type  New Palliative care  Reason for referral  Clarify Goals of Care  Date of Admission  01/21/2018  Date first seen by Palliative Care  01/27/18  # of days Palliative referral response time  1 Day(s)  # of days IP prior to Palliative referral  4  Clinical Assessment  Palliative Performance Scale Score  20%  Pain Max last 24 hours  Not able to report  Pain Min Last 24 hours  Not able to report  Dyspnea Max Last 24 Hours  Not able to report  Dyspnea  Min Last 24 hours  Not able to report  Nausea Max Last 24 Hours  Not able to report  Nausea Min Last 24 Hours  Not able to report  Anxiety Max Last 24 Hours  Not able to report  Anxiety Min Last 24 Hours  Not able to report  Other Max Last 24 Hours  Not able to report  Psychosocial & Spiritual Assessment  Palliative Care Outcomes  Patient/Family meeting held?  Yes  Who was at the meeting?  great niece,  FU sch for 4/13 with other family  Palliative Care follow-up planned  Yes, Facility      Time In: 0930 Time Out: 1040 Time Total: 70 min Greater than 50%  of this time was spent counseling and coordinating care related to the above assessment and plan.  Signed by: Dory Horn, NP   Please contact Palliative Medicine Team phone at [phone] for questions and concerns.  For individual provider: See Shea Evans

## 2018-01-27 NOTE — Care Management Note (Signed)
Case Management Note  Patient Details  Name: Tommy Bentley MRN: [id number] Date of Birth: 1950-12-25  Subjective/Objective:   Pt admitted with Stroke                  Action/Plan:  PTA from home.  Pt remains on vent.  CM will continue to follow for discharge needs   Expected Discharge Date:  01/26/18               Expected Discharge Plan:     In-House Referral:     Discharge planning Services  CM Consult  Post Acute Care Choice:    Choice offered to:     DME Arranged:    DME Agency:     HH Arranged:    HH Agency:     Status of Service:     If discussed at [employer], dates discussed:    Additional Comments:  Maryclare Labrador, RN 01/27/2018, 4:08 PM

## 2018-01-28 ENCOUNTER — Inpatient Hospital Stay (HOSPITAL_COMMUNITY): Payer: Medicare Other

## 2018-01-28 LAB — GLUCOSE, CAPILLARY
GLUCOSE-CAPILLARY: 118 mg/dL — AB (ref 65–99)
GLUCOSE-CAPILLARY: 132 mg/dL — AB (ref 65–99)
GLUCOSE-CAPILLARY: 138 mg/dL — AB (ref 65–99)
GLUCOSE-CAPILLARY: 152 mg/dL — AB (ref 65–99)
Glucose-Capillary: 77 mg/dL (ref 65–99)
Glucose-Capillary: 151 mg/dL — ABNORMAL HIGH (ref 65–99)
Glucose-Capillary: 107 mg/dL — ABNORMAL HIGH (ref 65–99)

## 2018-01-28 LAB — RENAL FUNCTION PANEL
Albumin: 1.5 g/dL — ABNORMAL LOW (ref 3.5–5.0)
Anion gap: 4 — ABNORMAL LOW (ref 5–15)
BUN: 34 mg/dL — ABNORMAL HIGH (ref 6–20)
CHLORIDE: 112 mmol/L — AB (ref 101–111)
CO2: 22 mmol/L (ref 22–32)
CREATININE: 1.46 mg/dL — AB (ref 0.61–1.24)
Calcium: 7.6 mg/dL — ABNORMAL LOW (ref 8.9–10.3)
GFR calc Af Amer: 56 mL/min — ABNORMAL LOW (ref >60)
GFR calc non Af Amer: 48 mL/min — ABNORMAL LOW (ref >60)
Glucose, Bld: 169 mg/dL — ABNORMAL HIGH (ref 65–99)
POTASSIUM: 3.6 mmol/L (ref 3.5–5.1)
Phosphorus: 1.8 mg/dL — ABNORMAL LOW (ref 2.5–4.6)
Sodium: 138 mmol/L (ref 135–145)

## 2018-01-28 LAB — CBC
HEMATOCRIT: 22.4 % — AB (ref 39.0–52.0)
Hemoglobin: 7 g/dL — ABNORMAL LOW (ref 13.0–17.0)
MCH: 26.6 pg (ref 26.0–34.0)
MCHC: 31.3 g/dL (ref 30.0–36.0)
MCV: 85.2 fL (ref 78.0–100.0)
PLATELETS: 32 10*3/uL — AB (ref 150–400)
RBC: 2.63 MIL/uL — AB (ref 4.22–5.81)
RDW: 22.9 % — ABNORMAL HIGH (ref 11.5–15.5)
WBC: 7.3 10*3/uL (ref 4.0–10.5)

## 2018-01-28 LAB — PHOSPHORUS
PHOSPHORUS: 2.7 mg/dL (ref 2.5–4.6)
Phosphorus: 1.8 mg/dL — ABNORMAL LOW (ref 2.5–4.6)

## 2018-01-28 LAB — MAGNESIUM
MAGNESIUM: 2 mg/dL (ref 1.7–2.4)
Magnesium: 2 mg/dL (ref 1.7–2.4)

## 2018-01-28 MED ORDER — POTASSIUM PHOSPHATES 15 MMOLE/5ML IV SOLN
24.0000 mmol | Freq: Once | INTRAVENOUS | Status: AC
Start: 2018-01-28 — End: 2018-01-28
  Administered 2018-01-28: 24 mmol via INTRAVENOUS
  Filled 2018-01-28: qty 8

## 2018-01-28 MED ORDER — CHLORHEXIDINE GLUCONATE CLOTH 2 % EX PADS
6.0000 | MEDICATED_PAD | Freq: Every day | CUTANEOUS | Status: DC
  Administered 2018-01-28 – 2018-02-01 (×4): 6 via TOPICAL

## 2018-01-28 NOTE — Progress Notes (Signed)
Daily Progress Note   Patient Name: Tommy Bentley       Date: 01/28/2018 DOB: 02-Oct-1951  Age: 67 y.o. MRN#: [id number] Attending Physician: Brand Males, MD Primary Care Physician: Patient, No Pcp Per Admit Date: 02/09/2018  Reason for Consultation/Follow-up: Establishing goals of care, Psychosocial/spiritual support and Withdrawal of life-sustaining treatment  Subjective: Patient seen chart, chart reviewed.  Met with patient's niece, 2 great nieces this morning to discuss goals of care specifically care limitations and in the  setting of acute illness, intubation.  Patient is a full DNR.  Core track was placed last night.  When sedation has been reduced patient has become easily agitated  Family hopes that either 1 of 2 things happen: He improves enough to come home to stay with his family or if he is unable to do that, that he go to residential hospice for end-of-life  Length of Stay: 5  Current Medications: Scheduled Meds:  . chlorhexidine gluconate (MEDLINE KIT)  15 mL Mouth Rinse BID  . Chlorhexidine Gluconate Cloth  6 each Topical Daily  . folic acid  1 mg Intravenous Daily  . insulin aspart  0-9 Units Subcutaneous Q4H  . mouth rinse  15 mL Mouth Rinse 10 times per day  . pantoprazole (PROTONIX) IV  40 mg Intravenous Daily  . sodium chloride flush  10-40 mL Intracatheter Q12H  . sodium chloride flush  3 mL Intravenous Q12H  . thiamine  100 mg Intravenous Daily    Continuous Infusions: . sodium chloride 10 mL/hr at 01/27/18 2315  . dexmedetomidine 1.2 mcg/kg/hr (01/28/18 0030)  . feeding supplement (VITAL AF 1.2 CAL) 45 mL/hr at 01/27/18 2100  . potassium PHOSPHATE IVPB (mmol)      PRN Meds: acetaminophen **OR** acetaminophen, fentaNYL (SUBLIMAZE) injection,  ipratropium-albuterol, midazolam, midazolam, ondansetron **OR** ondansetron (ZOFRAN) IV, senna-docusate, sodium chloride flush  Physical Exam  Constitutional:  Acutely ill older [place] man seen in ICU; he is currently intubated  HENT:  Head: Normocephalic and atraumatic.  Pulmonary/Chest:  Intubated  Abdominal:  Core track placed last night  Genitourinary:  Genitourinary Comments: Foley catheter  Neurological:  Patient easily agitated when sedation is reduced  Skin: Skin is warm and dry.  Psychiatric:  With sedation is decreased, patient becomes readily agitated otherwise unable to test  Nursing note and vitals reviewed.  Vital Signs: BP (!) 147/78   Pulse 90   Temp 98.6 F (37 C) (Oral)   Resp (!) 22   Ht '5\' 3"'  (1.6 m)   Wt 47.1 kg (103 lb 13.4 oz)   SpO2 100%   BMI 18.39 kg/m  SpO2: SpO2: 100 % O2 Device: O2 Device: Ventilator O2 Flow Rate: O2 Flow Rate (L/min): 40 L/min  Intake/output summary:   Intake/Output Summary (Last 24 hours) at 01/28/2018 1218 Last data filed at 01/28/2018 0800 Gross per 24 hour  Intake 1360.27 ml  Output 537 ml  Net 823.27 ml   LBM: Last BM Date: 01/24/18 Baseline Weight: Weight: 59 kg (130 lb) Most recent weight: Weight: 47.1 kg (103 lb 13.4 oz)       Palliative Assessment/Data:    Flowsheet Rows     Most Recent Value  Intake Tab  Referral Department  Critical care  Unit at Time of Referral  ICU  Palliative Care Primary Diagnosis  Neurology  Date Notified  01/26/18  Palliative Care Type  New Palliative care  Reason for referral  Clarify Goals of Care  Date of Admission  01/20/2018  Date first seen by Palliative Care  01/27/18  # of days Palliative referral response time  1 Day(s)  # of days IP prior to Palliative referral  4  Clinical Assessment  Palliative Performance Scale Score  20%  Pain Max last 24 hours  Not able to report  Pain Min Last 24 hours  Not able to report  Dyspnea Max Last 24 Hours  Not able  to report  Dyspnea Min Last 24 hours  Not able to report  Nausea Max Last 24 Hours  Not able to report  Nausea Min Last 24 Hours  Not able to report  Anxiety Max Last 24 Hours  Not able to report  Anxiety Min Last 24 Hours  Not able to report  Other Max Last 24 Hours  Not able to report  Psychosocial & Spiritual Assessment  Palliative Care Outcomes  Patient/Family meeting held?  Yes  Who was at the meeting?  great niece,  FU sch for 4/13 with other family  Palliative Care follow-up planned  Yes, Facility      Patient Active Problem List   Diagnosis Date Noted  . Goals of care, counseling/discussion   . Palliative care by specialist   . Endotracheally intubated   . Hemoptysis   . AKI (acute kidney injury) ([place])   . Acute myeloid leukemia not having achieved remission ([place])   . Acute respiratory failure with hypoxia ([place])   . Lymphocytosis   . Leukoerythroblastotic reaction   . Acute ischemic stroke ([place]) 01/23/2018  . Protein-calorie malnutrition, severe 01/23/2018  . Anemia   . Thrombocytopenia ([place])   . Smoker   . PVD (peripheral vascular disease) ([place])   . Stenosis of intracranial portion of both internal carotid arteries   . Gastrointestinal hemorrhage     Palliative Care Assessment & Plan   Patient Profile:  67 y.o. male  with little known past medical history admitted on 02/10/2018 with right-sided weakness, fatigue and aphasia.  He was found to have had an acute left MCA infarct as well as subacute right hemorrhagic infarcts.  Patient had had the flu 3 weeks prior to admission.  Upon admission he was found to have a critically low hemoglobin of 3.5, platelets 43 and received transfusion.  Patient has been seen by oncological services secondary to profound anemia, thrombocytopenia and lymphocytosis and  concern is now acute myelogenous leukemia  Consult ordered for goals of care and potentially withdrawal of life support.    Recommendations/Plan:  Continue with  weaning process.  Should it become clear in less than 10-14 days that he is going to be unable to wean, breathe on his own, family would elect to liberate him from the vent.  Otherwise they wish to see if he can breathe on his own  Were he to successfully extubate and then develop respiratory distress, do not reintubate  Continue with core track feeding for now.  Monitor for signs of fluid overload.  Educated family that this might be a place where we would need to discontinue this.  If he were to dislodge this during a time period where it looks as though he may not successfully extubate on his own, do not reinsert.  If for example, he were to dislodge it tonight,  family would elect to have the core track replaced  No trach no PEG  Palliative medicine to stay involved and monitor patient's clinical status and the need for one-way extubation and movement towards residential hospice    Goals of Care and Additional Recommendations:  Limitations on Scope of Treatment: Minimize Medications, No Chemotherapy, No Hemodialysis, No Radiation, No Surgical Procedures and No Tracheostomy  Code Status:    Code Status Orders  (From admission, onward)        Start     Ordered   01/26/18 1512  Do not attempt resuscitation (DNR)  Continuous    Question Answer Comment  In the event of cardiac or respiratory ARREST Do not call a "code blue"   In the event of cardiac or respiratory ARREST Do not perform Intubation, CPR, defibrillation or ACLS   In the event of cardiac or respiratory ARREST Use medication by any route, position, wound care, and other measures to relive pain and suffering. May use oxygen, suction and manual treatment of airway obstruction as needed for comfort.   Comments Currently intubated. Will not re-intubate once extubated but continue current mechanical ventilation.      01/26/18 1512    Code Status History    Date Active Date Inactive Code Status Order ID Comments User Context     01/23/2018 0006 01/26/2018 1512 Full Code [id number]  Lorella Nimrod, MD ED       Prognosis:  Patient is critically ill now with CVA, pneumonia as well as a new diagnosis of AML.  He is at high risk for acute decompensation; if this were to happen he would have a very limited prognosis of just hours to day's .  Once patient is off the vent we will be able to ascertain whether he meets residential hospice criteria versus home with hospice, or hospital death  Discharge Planning:  To Be Determined  Care plan was discussed with Dr. Chase Caller  Thank you for allowing the Palliative Medicine Team to assist in the care of this patient.   Time In: 1100 Time Out: 1200 Total Time 60 min Prolonged Time Billed  no       Greater than 50%  of this time was spent counseling and coordinating care related to the above assessment and plan.  Dory Horn, NP  Please contact Palliative Medicine Team phone at [phone] for questions and concerns.

## 2018-01-28 NOTE — Progress Notes (Signed)
PULMONARY / CRITICAL CARE MEDICINE   Name: Tommy Bentley MRN: [id number] DOB: April 22, 1951    ADMISSION DATE:  01/25/2018  REFERRING MD:  Dr. Shan Levans ([facility])  CHIEF COMPLAINT:  Aphasia, bloody stool, and right sided weakness  Brief patient summary: 67 y.o [place] male with unknown med hx presented with aphasia, right weakness. Imaging sig for bilateral mca infarcts and right pca infarct. MRA with right carotid siphon stenosis. LE venous doppler negative for dvt. Labs sig for hb=3.5, plt=40 got 5u on admission. GI following, plan is to do EGD once stable. Onc on board thinks maybe mantle cell lymphoma, aml, or apl. Flow cytometry results consistent with AML and pathology review showed Leukoerythroblastic smear with shistocytes and NRBC's. Kappa lamda free light chains elevated, ratio normal.   STUDIES:  MRI(4/7):  1. Numerous small foci of acute/early subacute infarction are present throughout the left hemisphere, largely in a watershed distribution. 2. Late subacute to chronic hemorrhagic infarct in right occipital lobe. 3. No acute hemorrhage, mass effect, or herniation. 4. Background of moderate chronic microvascular ischemic changes and parenchymal volume loss of the brain.  CT head (4/7): Areas of low-density in the right occipital lobe and within the left hemispheric white matter worrisome for acute/subacute infarctions. No hemorrhage or mass effect.  MRA (4/7): 1. Left M2 superior division appears to have common origin with left A1 and is occluded proximally. 2. No additional large vessel occlusion of the intracranial circulation identified.  MRA neck: Severe motion artifact. Patent carotid systems and vertebral arteries. No definite evidence for hemodynamically significant stenosis by NASCET criteria.  CT abdomen/pelvis 4/7:negative for acute intra-abdominal processes   CXR 4/9:diffuse, bilateral airspace opacities, no focal consolidations or pleural effusions    CULTURES: RVP 4/9 - neg BAL 4/9 >> no growth  ANTIBIOTICS: Azithro 4/7 Ceftriaxone 4/7 Cefepime 4/8 >> 4/9  LINES/TUBES: PIV LUE ETT 4/9 CVC LIJ4/9   SIGNIFICANT EVENTS: 4/7 > admitted to IMTS with Lt sided CVA, Hgb 3.9 - transfused 4 units PRBCs 4/9 > admitted to ICU + bronchoscopy + intubation 4/11 > Code status changed to DNR  4/12 - patient is sedated and intubated. Family decided to change code status to DNR on 4/11.   SUBJECTIVE/OVERNIGHT/INTERVAL HX 4/13 - pallaitive care meeting in progress. On precedex - agitated otherwise per RN. Ongoing alveolar hemorrhage via subglottic suction. Making urine. Not on pressors   VITAL SIGNS: BP (!) 147/78   Pulse 90   Temp 98.6 F (37 C) (Oral)   Resp (!) 22   Ht _0  (1.6 m)   Wt 47.1 kg (103 lb 13.4 oz)   SpO2 100%   BMI 18.39 kg/m   VENTILATOR SETTINGS: Vent Mode: PSV;CPAP FiO2 (%):  [40 %] 40 % Set Rate:  [16 bmp] 16 bmp Vt Set:  [340 mL] 340 mL PEEP:  [5 cmH20] 5 cmH20 Pressure Support:  [10 cmH20] 10 cmH20 Plateau Pressure:  [9 cmH20-18 cmH20] 13 cmH20  INTAKE / OUTPUT: I/O last 3 completed shifts: In: 2371.3 [I.V.:1631.8; NG/GT:739.5] Out: 862 [Urine:787; Emesis/NG output:75]  PHYSICAL EXAMINATION:  General Appearance:    Looks criticall ill OBESE - no  Head:    Normocephalic, without obvious abnormality, atraumatic  Eyes:    PERRL - yues, conjunctiva/corneas - clear      Ears:    Normal external ear canals, both ears  Nose:   NG tube - has panda  Throat:  ETT TUBE - yes and subglottic suction with blood , OG tube - no  Neck:   Supple,  No enlargement/tenderness/nodules     Lungs:     Clear to auscultation bilaterally, Ventilator   Synchrony - yes but if not on precedex - dysynch +  Chest wall:    No deformity  Heart:    S1 and S2 normal, no murmur, CVP - no.  Pressors - no  Abdomen:     Soft, no masses, no organomegaly  Genitalia:    Not done  Rectal:   not done  Extremities:    Extremities- intact     Skin:   Intact in exposed areas .      Neurologic:   Sedation - precedex gtt -> RASS - -2 . Moves all 4s - yes intermittently restless. CAM-ICU - positive delirium . Orientation - not orientee     PULMONARY Recent Labs  Lab 01/24/18 0135 01/24/18 0427 01/25/18 0342 01/26/18 0322  PHART 7.410 7.347* 7.293* 7.306*  PCO2ART 29.2* 40.8 40.7 45.3  PO2ART 56.3* 318.0* 99.0 155*  HCO3 18.1* 22.4 19.6* 21.9  TCO2  --  24 21*  --   O2SAT 86.7 100.0 97.0 98.8    CBC Recent Labs  Lab 01/26/18 0419 01/27/18 0316 01/28/18 0325  HGB 8.4* 7.8* 7.0*  HCT 26.4* 24.6* 22.4*  WBC 10.7* 10.2 7.3  PLT 42* 25* 32*    COAGULATION Recent Labs  Lab 01/30/2018 1719 01/24/18 0455  INR 1.30 1.32    CARDIAC  No results for input(s): TROPONINI in the last 168 hours. No results for input(s): PROBNP in the last 168 hours.   CHEMISTRY Recent Labs  Lab 01/25/18 0431 01/25/18 1724 01/26/18 0419 01/27/18 0316 01/27/18 1124 01/27/18 1614 01/28/18 0325 01/28/18 0500  NA 142 143 144 140  --   --   --  138  K 4.0 3.8 3.5 3.5  --   --   --  3.6  CL 111 112* 112* 112*  --   --   --  112*  CO2 20* 20* 21* 22  --   --   --  22  GLUCOSE 95 91 90 105*  --   --   --  169*  BUN 31* 31* 34* 30*  --   --   --  34*  CREATININE 1.63* 1.90* 2.18* 1.75*  --   --   --  1.46*  CALCIUM 7.4* 7.7* 8.0* 7.6*  --   --   --  7.6*  MG 2.2  --   --  2.2 2.1 2.0 2.0  --   PHOS 4.5  --   --  2.6 2.2* 2.5 1.8* 1.8*   Estimated Creatinine Clearance: 33.2 mL/min (A) (by C-G formula based on SCr of 1.46 mg/dL (H)).   LIVER Recent Labs  Lab 01/30/2018 1719 01/23/18 0205 01/24/18 0315 01/24/18 0455 01/27/18 0316 01/28/18 0500  AST _0 --   --   --   ALT 10* 10* 11*  --   --   --   ALKPHOS 55 52 58  --   --   --   BILITOT 1.7* 1.5* 2.7*  --   --   --   PROT 8.0 7.5 7.8  --   --   --   ALBUMIN 2.5* 2.3* 2.4*  --  1.6* 1.5*  INR 1.30  --   --  1.32  --   --       INFECTIOUS Recent Labs  Lab 01/24/18 0955 01/24/18 1448 01/25/18 0431 01/26/18 0419  LATICACIDVEN 1.3 1.3  --   --   PROCALCITON 1.27  --  1.46 1.62     ENDOCRINE CBG (last 3)  Recent Labs    01/28/18 0328 01/28/18 0811 01/28/18 1113  GLUCAP 152* 138* 77         IMAGING x48h  - image(s) personally visualized  -   highlighted in bold Dg Chest Port 1 View  Result Date: 01/28/2018 CLINICAL DATA:  Anna base shin EXAM: PORTABLE CHEST 1 VIEW COMPARISON:  Chest x-rays dated 01/27/2018 and 01/26/2018. FINDINGS: Perihilar opacities are stable in extent, although now denser on the RIGHT, a get and RIGHT greater than LEFT. No pleural effusion or pneumothorax seen. Stable cardiomegaly. Endotracheal tube is well positioned with tip just above the level of the carina. Enteric tube passes below the diaphragm. LEFT IJ central line is stable in position with tip at the level of the mid/lower SVC. IMPRESSION: 1. Persistent perihilar opacities, stable in overall extent although denser on the RIGHT today, again RIGHT greater than LEFT. Differential includes multifocal pneumonia, pulmonary edema and aspiration. 2. Support apparatus appears appropriately positioned. Electronically Signed   By: Franki Cabot M.D.   On: 01/28/2018 07:32   Dg Chest Port 1 View  Result Date: 01/27/2018 CLINICAL DATA:  Evaluate endotracheal tube EXAM: PORTABLE CHEST 1 VIEW COMPARISON:  01/26/2018 FINDINGS: Endotracheal tube and left jugular central line are again noted and stable. Patchy infiltrates are again identified bilaterally right greater than left. Cardiac shadow is stable. No pneumothorax is noted. No bony abnormality is seen. IMPRESSION: Stable bilateral infiltrates right greater than left. Electronically Signed   By: Inez Catalina M.D.   On: 01/27/2018 07:26     ASSESSMENT / PLAN:  PULMONARY A: Acute hypoxic respiratory failure - CXR with diffuse airspace disease, concern for blast crisis vs DAH vs  TRALI  Bilateral pulmonary infiltrates Bronch 4/9 suggestive of pulmonary alveolar hemorrhage   01/28/2018 - > does not meet criteria for SBT/Extubation in setting of Acute Respiratory Failure due to agitated encephalopathy. Has oing mild intermittent alveoler hemirrhage  P: Vent support - wean/SBT - will be one way extubation depending on palliative care meeting outcome 01/28/18  CARDIOVASCULAR A: Tachycardia - likely secondary to hypoxia - improved HFpEF -(01/23/18) EF 55%, G2DD, mild MR  4/13 - not on pressors  P: MAP goal > 65 Monitor volume status   RENAL A: AKI-likely prerenal from poor po intake +/- lasix diuresis  4/13 - AKI better but has low phos and low K   P: Replete K phos Monitor BMET/UOP/I&Os  GASTROINTESTINAL A: ? Upper GI bleed - stabilized   - no active gi blled P: EGD on hold per GI Monitor Hgb, signs/symptoms of further bleeding NPO  HEMATOLOGIC A: AML - new dx this admit  Microcytic anemia s/p 5u pRBCs and plt in the 40s.  Leukoerythroblastic reaction Thrombocytopenia   - hgb 7 and plat 32k  P: Monitor CBC and CMP If platelets<50,000 and active bleeding or if platelets<20,000 then transfuse platelets - PRBC for hgb </= 6.9gm%    - exceptions are   -  if ACS susepcted/confirmed then transfuse for hgb </= 8.0gm%,  or    -  active bleeding with hemodynamic instability, then transfuse regardless of hemoglobin value   At at all times try to transfuse 1 unit prbc as possible with exception of active hemorrhage    INFECTIOUS A: Immunocompromised in the setting of likely hematological malignancy RVP negative  BAL (4/9) no growth  P Anti-infectives (From admission, onward)   Start     Dose/Rate Route Frequency Ordered Stop   01/24/18 1600  ceFEPIme (MAXIPIME) 2 g in sodium chloride 0.9 % 100 mL IVPB  Status:  Discontinued     2 g 200 mL/hr over 30 Minutes Intravenous Every 12 hours 01/24/18 0424 01/25/18 0942    01/24/18 0430  ceFEPIme (MAXIPIME) 2 g in sodium chloride 0.9 % 100 mL IVPB     2 g 200 mL/hr over 30 Minutes Intravenous  Once 01/24/18 0424 01/24/18 0540   02/08/2018 2230  cefTRIAXone (ROCEPHIN) 1 g in sodium chloride 0.9 % 100 mL IVPB     1 g 200 mL/hr over 30 Minutes Intravenous  Once 02/01/2018 2216 01/23/18 0112   01/23/2018 2230  azithromycin (ZITHROMAX) 500 mg in sodium chloride 0.9 % 250 mL IVPB     500 mg 250 mL/hr over 60 Minutes Intravenous  Once 01/19/2018 2216 01/23/18 0112       ENDOCRINE A: Hypoglycemia. Off d10 . Doing well with TF P: - ssi   NEUROLOGIC A: L + R occipital lobe hemorrhagic strokes - no tPA given, Neuro deficits include R facial droop, RUE weakness, and inability to follow commands. Sedation P: -Holding antiplatelets and embolic workup in the setting of severe thrombocytopenia RASS goal:0, -1 -Precedex gtt -> change to moprhine gtt if terminal wean   FAMILY - Updates: Family updated 4/11 - Code status updated to DNR. Plan for one-way extubation when ready. Family would like to be notified prior to extubation. aWait outcome of pallaitive care meeting 01/28/18     The patient is critically ill with multiple organ systems failure and requires high complexity decision making for assessment and support, frequent evaluation and titration of therapies, application of advanced monitoring technologies and extensive interpretation of multiple databases.   Critical Care Time devoted to patient care services described in this note is  30  Minutes. This time reflects time of care of this signee Dr Brand Males. This critical care time does not reflect procedure time, or teaching time or supervisory time of PA/NP/Med student/Med Resident etc but could involve care discussion time    Dr. Brand Males, M.D., [facility].C.P Pulmonary and Critical Care Medicine Staff Physician [facility] Pager: [phone], If no answer or between  15:00h - 7:00h: call [phone]  01/28/2018 11:29 AM

## 2018-01-29 LAB — CBC
HCT: 20.8 % — ABNORMAL LOW (ref 39.0–52.0)
HEMATOCRIT: 22.6 % — AB (ref 39.0–52.0)
HEMOGLOBIN: 6.5 g/dL — AB (ref 13.0–17.0)
Hemoglobin: 7 g/dL — ABNORMAL LOW (ref 13.0–17.0)
MCH: 26.4 pg (ref 26.0–34.0)
MCH: 26.7 pg (ref 26.0–34.0)
MCHC: 31 g/dL (ref 30.0–36.0)
MCHC: 31.3 g/dL (ref 30.0–36.0)
MCV: 85.3 fL (ref 78.0–100.0)
MCV: 85.6 fL (ref 78.0–100.0)
Platelets: 30 10*3/uL — ABNORMAL LOW (ref 150–400)
Platelets: 27 10*3/uL — CL (ref 150–400)
RBC: 2.65 MIL/uL — ABNORMAL LOW (ref 4.22–5.81)
RBC: 2.43 MIL/uL — AB (ref 4.22–5.81)
RDW: 22.7 % — AB (ref 11.5–15.5)
RDW: 23.2 % — ABNORMAL HIGH (ref 11.5–15.5)
WBC: 13.7 10*3/uL — AB (ref 4.0–10.5)
WBC: 12.1 10*3/uL — AB (ref 4.0–10.5)

## 2018-01-29 LAB — RENAL FUNCTION PANEL
Albumin: 1.6 g/dL — ABNORMAL LOW (ref 3.5–5.0)
Anion gap: 8 (ref 5–15)
BUN: 39 mg/dL — AB (ref 6–20)
CHLORIDE: 110 mmol/L (ref 101–111)
CO2: 22 mmol/L (ref 22–32)
Calcium: 7.3 mg/dL — ABNORMAL LOW (ref 8.9–10.3)
Creatinine, Ser: 1.2 mg/dL (ref 0.61–1.24)
GFR calc Af Amer: >60 mL/min (ref >60)
Glucose, Bld: 132 mg/dL — ABNORMAL HIGH (ref 65–99)
POTASSIUM: 3.9 mmol/L (ref 3.5–5.1)
Phosphorus: 2.3 mg/dL — ABNORMAL LOW (ref 2.5–4.6)
Sodium: 140 mmol/L (ref 135–145)

## 2018-01-29 LAB — POCT I-STAT 3, ART BLOOD GAS (G3+)
Acid-base deficit: 2 mmol/L (ref 0.0–2.0)
BICARBONATE: 23.8 mmol/L (ref 20.0–28.0)
O2 Saturation: 99 %
Patient temperature: 99.6
TCO2: 25 mmol/L (ref 22–32)
pCO2 arterial: 46.4 mmHg (ref 32.0–48.0)
pH, Arterial: 7.321 — ABNORMAL LOW (ref 7.350–7.450)
pO2, Arterial: 140 mmHg — ABNORMAL HIGH (ref 83.0–108.0)

## 2018-01-29 LAB — GLUCOSE, CAPILLARY
GLUCOSE-CAPILLARY: 126 mg/dL — AB (ref 65–99)
GLUCOSE-CAPILLARY: 132 mg/dL — AB (ref 65–99)
GLUCOSE-CAPILLARY: 124 mg/dL — AB (ref 65–99)
GLUCOSE-CAPILLARY: 134 mg/dL — AB (ref 65–99)
GLUCOSE-CAPILLARY: 146 mg/dL — AB (ref 65–99)

## 2018-01-29 LAB — PREPARE RBC (CROSSMATCH)

## 2018-01-29 MED ORDER — SODIUM CHLORIDE 0.9 % IV SOLN: Freq: Once | INTRAVENOUS | Status: AC

## 2018-01-29 MED ORDER — PANTOPRAZOLE SODIUM 40 MG PO PACK
40.0000 mg | PACK | Freq: Every day | ORAL | Status: DC
  Administered 2018-01-30 – 2018-02-01 (×3): 40 mg
  Filled 2018-01-29 (×3): qty 20

## 2018-01-29 MED ORDER — SODIUM CHLORIDE 0.9 % IV SOLN
2.0000 g | INTRAVENOUS | Status: DC
  Administered 2018-01-29 – 2018-01-31 (×3): 2 g via INTRAVENOUS
  Filled 2018-01-29 (×3): qty 2

## 2018-01-29 MED ORDER — MIDAZOLAM HCL 2 MG/2ML IJ SOLN
2.0000 mg | INTRAMUSCULAR | Status: DC | PRN
  Administered 2018-01-29 (×2): 2 mg via INTRAVENOUS
  Filled 2018-01-29 (×2): qty 2

## 2018-01-29 MED ORDER — FOLIC ACID 1 MG PO TABS
1.0000 mg | ORAL_TABLET | Freq: Every day | ORAL | Status: DC
Start: 2018-01-30 — End: 2018-02-01
  Administered 2018-01-30 – 2018-02-01 (×3): 1 mg
  Filled 2018-01-29 (×3): qty 1

## 2018-01-29 MED ORDER — VITAMIN B-1 100 MG PO TABS
100.0000 mg | ORAL_TABLET | Freq: Every day | ORAL | Status: DC
  Administered 2018-01-30 – 2018-02-01 (×3): 100 mg
  Filled 2018-01-29 (×3): qty 1

## 2018-01-29 MED ORDER — VANCOMYCIN HCL IN DEXTROSE 1-5 GM/200ML-% IV SOLN
1000.0000 mg | Freq: Once | INTRAVENOUS | Status: AC
  Administered 2018-01-29: 1000 mg via INTRAVENOUS
  Filled 2018-01-29: qty 200

## 2018-01-29 MED ORDER — FENTANYL CITRATE (PF) 100 MCG/2ML IJ SOLN
50.0000 ug | INTRAMUSCULAR | Status: DC | PRN
Start: 2018-01-29 — End: 2018-01-29
  Administered 2018-01-29 (×4): 50 ug via INTRAVENOUS
  Filled 2018-01-29 (×4): qty 2

## 2018-01-29 MED ORDER — FENTANYL CITRATE (PF) 100 MCG/2ML IJ SOLN
100.0000 ug | INTRAMUSCULAR | Status: AC | PRN
Start: 2018-01-29 — End: 2018-01-30
  Administered 2018-01-29 – 2018-01-30 (×5): 100 ug via INTRAVENOUS
  Filled 2018-01-29 (×5): qty 2

## 2018-01-29 MED ORDER — VANCOMYCIN HCL IN DEXTROSE 750-5 MG/150ML-% IV SOLN
750.0000 mg | INTRAVENOUS | Status: DC
  Administered 2018-01-30: 750 mg via INTRAVENOUS
  Filled 2018-01-29: qty 150

## 2018-01-29 NOTE — Progress Notes (Signed)
PULMONARY / CRITICAL CARE MEDICINE   Name: Tommy Bentley MRN: [id number] DOB: January 24, 1951    ADMISSION DATE:  02/11/2018  REFERRING MD:  Dr. Shan Levans ([facility])  CHIEF COMPLAINT:  Aphasia, bloody stool, and right sided weakness  Brief patient summary: 67 y.o [place] male with unknown med hx presented with aphasia, right weakness. Imaging sig for bilateral mca infarcts and right pca infarct. MRA with right carotid siphon stenosis. LE venous doppler negative for dvt. Labs sig for hb=3.5, plt=40 got 5u on admission. GI following, plan is to do EGD once stable. Onc on board thinks maybe mantle cell lymphoma, aml, or apl. Flow cytometry results consistent with AML and pathology review showed Leukoerythroblastic smear with shistocytes and NRBC's. Kappa lamda free light chains elevated, ratio normal.   STUDIES:  MRI(4/7):  1. Numerous small foci of acute/early subacute infarction are present throughout the left hemisphere, largely in a watershed distribution. 2. Late subacute to chronic hemorrhagic infarct in right occipital lobe. 3. No acute hemorrhage, mass effect, or herniation. 4. Background of moderate chronic microvascular ischemic changes and parenchymal volume loss of the brain.  CT head (4/7): Areas of low-density in the right occipital lobe and within the left hemispheric white matter worrisome for acute/subacute infarctions. No hemorrhage or mass effect.  MRA (4/7): 1. Left M2 superior division appears to have common origin with left A1 and is occluded proximally. 2. No additional large vessel occlusion of the intracranial circulation identified.  MRA neck: Severe motion artifact. Patent carotid systems and vertebral arteries. No definite evidence for hemodynamically significant stenosis by NASCET criteria.  CT abdomen/pelvis 4/7:negative for acute intra-abdominal processes   CXR 4/9:diffuse, bilateral airspace opacities, no focal consolidations or pleural effusions    CULTURES: RVP 4/9 - neg BAL 4/9 >> no growth  ANTIBIOTICS: Azithro 4/7 Ceftriaxone 4/7 Cefepime 4/8 >> 4/9  LINES/TUBES: PIV LUE ETT 4/9 CVC LIJ4/9  SIGNIFICANT EVENTS: 4/7 > admitted to IMTS with Lt sided CVA, Hgb 3.9 - transfused 4 units PRBCs 4/9 > admitted to ICU + bronchoscopy + intubation 4/11 > Code status changed to DNR 4/12 - patient is sedated and intubated. Family decided to change code status to DNR on 4/11. 4/13 - pallaitive care meeting in progress. On precedex - agitated otherwise per RN. Ongoing alveolar hemorrhage via subglottic suction. Making urine. Not on pressors   SUBJECTIVE/OVERNIGHT/INTERVAL HX Required increased fentanyl and versed overnight for pain/agitation.   VITAL SIGNS: BP (!) 105/48 (BP Location: Right Arm)   Pulse 73   Temp 99.4 F (37.4 C) (Oral)   Resp (!) 28   Ht _0  (1.6 m)   Wt 105 lb 13.1 oz (48 kg)   SpO2 100%   BMI 18.75 kg/m   VENTILATOR SETTINGS: Vent Mode: PRVC FiO2 (%):  [40 %] 40 % Set Rate:  [16 bmp] 16 bmp Vt Set:  [340 mL] 340 mL PEEP:  [5 cmH20] 5 cmH20 Pressure Support:  [10 cmH20] 10 cmH20  INTAKE / OUTPUT: I/O last 3 completed shifts: In: 2973.3 [I.V.:835.3; NG/GT:1630; IV [id number]:010] Out: 9323 [Urine:1322]  PHYSICAL EXAMINATION: General: sedated, moving extremities spontaneously HEENT: Intubated, PERRL Cardiac: RRR, no rubs, murmurs or gallops Pulm: coarse breath sounds anteriorly Abd: soft, nondistended Ext: no pedal edema Neuro: sedated   PULMONARY Recent Labs  Lab 01/24/18 0135 01/24/18 0427 01/25/18 0342 01/26/18 0322  PHART 7.410 7.347* 7.293* 7.306*  PCO2ART 29.2* 40.8 40.7 45.3  PO2ART 56.3* 318.0* 99.0 155*  HCO3 18.1* 22.4 19.6* 21.9  TCO2  --  24 21*  --   O2SAT 86.7 100.0 97.0 98.8    CBC Recent Labs  Lab 01/26/18 0419 01/27/18 0316 01/28/18 0325  HGB 8.4* 7.8* 7.0*  HCT 26.4* 24.6* 22.4*  WBC 10.7* 10.2 7.3  PLT 42* 25* 32*    COAGULATION Recent Labs   Lab 01/26/2018 1719 01/24/18 0455  INR 1.30 1.32    CARDIAC  No results for input(s): TROPONINI in the last 168 hours. No results for input(s): PROBNP in the last 168 hours.   CHEMISTRY Recent Labs  Lab 01/25/18 0431 01/25/18 1724 01/26/18 0419 01/27/18 0316 01/27/18 1124 01/27/18 1614 01/28/18 0325 01/28/18 0500 01/28/18 1613  NA 142 143 144 140  --   --   --  138  --   K 4.0 3.8 3.5 3.5  --   --   --  3.6  --   CL 111 112* 112* 112*  --   --   --  112*  --   CO2 20* 20* 21* 22  --   --   --  22  --   GLUCOSE 95 91 90 105*  --   --   --  169*  --   BUN 31* 31* 34* 30*  --   --   --  34*  --   CREATININE 1.63* 1.90* 2.18* 1.75*  --   --   --  1.46*  --   CALCIUM 7.4* 7.7* 8.0* 7.6*  --   --   --  7.6*  --   MG 2.2  --   --  2.2 2.1 2.0 2.0  --  2.0  PHOS 4.5  --   --  2.6 2.2* 2.5 1.8* 1.8* 2.7   Estimated Creatinine Clearance: 33.8 mL/min (A) (by C-G formula based on SCr of 1.46 mg/dL (H)).   LIVER Recent Labs  Lab 02/08/2018 1719 01/23/18 0205 01/24/18 0315 01/24/18 0455 01/27/18 0316 01/28/18 0500  AST _0 --   --   --   ALT 10* 10* 11*  --   --   --   ALKPHOS 55 52 58  --   --   --   BILITOT 1.7* 1.5* 2.7*  --   --   --   PROT 8.0 7.5 7.8  --   --   --   ALBUMIN 2.5* 2.3* 2.4*  --  1.6* 1.5*  INR 1.30  --   --  1.32  --   --      INFECTIOUS Recent Labs  Lab 01/24/18 0955 01/24/18 1448 01/25/18 0431 01/26/18 0419  LATICACIDVEN 1.3 1.3  --   --   PROCALCITON 1.27  --  1.46 1.62     ENDOCRINE CBG (last 3)  Recent Labs    01/28/18 2022 01/28/18 2345 01/29/18 0453  GLUCAP 107* 132* 126*      IMAGING x48h  - image(s) personally visualized  -   highlighted in bold Dg Chest Port 1 View  Result Date: 01/28/2018 CLINICAL DATA:  Anna base shin EXAM: PORTABLE CHEST 1 VIEW COMPARISON:  Chest x-rays dated 01/27/2018 and 01/26/2018. FINDINGS: Perihilar opacities are stable in extent, although now denser on the RIGHT, a get and RIGHT greater  than LEFT. No pleural effusion or pneumothorax seen. Stable cardiomegaly. Endotracheal tube is well positioned with tip just above the level of the carina. Enteric tube passes below the diaphragm. LEFT IJ central line is stable in position with tip at the level of the  mid/lower SVC. IMPRESSION: 1. Persistent perihilar opacities, stable in overall extent although denser on the RIGHT today, again RIGHT greater than LEFT. Differential includes multifocal pneumonia, pulmonary edema and aspiration. 2. Support apparatus appears appropriately positioned. Electronically Signed   By: Franki Cabot M.D.   On: 01/28/2018 07:32     ASSESSMENT / PLAN:  PULMONARY A: Acute hypoxic respiratory failure - CXR with diffuse airspace disease, concern for blast crisis vs DAH vs TRALI  Bilateral pulmonary infiltrates Bronch 4/9 suggestive of pulmonary alveolar hemorrhage P: Vent support - wean/SBT - will be one way extubation  CARDIOVASCULAR A: Tachycardia - likely secondary to hypoxia - improved HFpEF -(01/23/18) EF 55%, G2DD, mild MR P: Monitor volume status   RENAL A: AKI-likely prerenal from poor po intake +/- lasix diuresis P: Replete electrolytes as needed Monitor BMET/UOP/I&Os  GASTROINTESTINAL A: No active GI bleed Nutrition P: Monitor Hgb, signs/symptoms of further bleeding Tube feeds  HEMATOLOGIC A: AML - new dx this admit Microcytic anemia s/p 5u pRBCs Leukoerythroblastic reaction Thrombocytopenia P: Monitor CBC and CMP If platelets<50,000 and active bleeding or if platelets<20,000 then transfuse platelets - PRBC for hgb </= 6.9gm%    - exceptions are   -  if ACS susepcted/confirmed then transfuse for hgb </= 8.0gm%,  or    -  active bleeding with hemodynamic instability, then transfuse regardless of hemoglobin value   At at all times try to transfuse 1 unit prbc as possible with exception of active hemorrhage  INFECTIOUS A: Immunocompromised in the  setting of likely hematological malignancy RVP negative  BAL (4/9) no growth   ENDOCRINE A: Hypoglycemia. Off d10 . Doing well with TF P: - ssi  NEUROLOGIC A: L + R occipital lobe hemorrhagic strokes - no tPA given, Neuro deficits include R facial droop, RUE weakness, and inability to follow commands. Sedation P: -Holding antiplatelets and embolic workup in the setting of severe thrombocytopenia RASS goal:0, -1 -Precedex gtt -> change to moprhine gtt if terminal wean   FAMILY - Updates: Family updated 4/11 - Code status updated to DNR. Plan for one-way extubation when ready. Family would like to be notified prior to extubation.   Palliative care meeting 4/13: Continue DNR, attempt liberation from vent, do not reintubate, no trach/no PEG. Continue Cortrak feeding for now.  Zada Finders, MD Internal Medicine PGY-3  01/29/2018 8:07 AM

## 2018-01-29 NOTE — Progress Notes (Signed)
Rn notified of pt temp of 101.6 F orally.

## 2018-01-29 NOTE — Progress Notes (Signed)
Pharmacy Antibiotic Note  Azul Troy is a 67 y.o. male admitted on 02/12/2018 with pneumonia.  Pharmacy has been consulted for vancomycin/cefepime dosing. WBC up to 13.7 and Tmax-101.6. SCr down to 1.20 and CrCl~ 40 ml/min .   Plan: Cefepime 2 gm every 24 hours Vancomycin 1000 mg X 1 Then 750 mg every 24 hours  Monitor renal function, clinical s/sx of infection, and VT as appropriate  Height: _0  (160 cm) Weight: 105 lb 13.1 oz (48 kg) IBW/kg (Calculated) : 56.9  Temp (24hrs), Avg:99.8 F (37.7 C), Min:98.8 F (37.1 C), Max:101.6 F (38.7 C)  Recent Labs  Lab 01/24/18 0955  01/24/18 1448  01/25/18 1724 01/25/18 2043 01/26/18 0419 01/27/18 0316 01/28/18 0325 01/28/18 0500 01/29/18 1035  WBC  --    < >  --    < >  --  12.7* 10.7* 10.2 7.3  --  13.7*  CREATININE  --   --   --    < > 1.90*  --  2.18* 1.75*  --  1.46* 1.20  LATICACIDVEN 1.3  --  1.3  --   --   --   --   --   --   --   --    < > = values in this interval not displayed.    Estimated Creatinine Clearance: 41.1 mL/min (by C-G formula based on SCr of 1.2 mg/dL).    No Known Allergies  Antimicrobials this admission: 4/8 Azithro x1 4/8 CTX x1 4/9 Cefepime >>4/10, 4/14>> 4/14 Vanc>>   4/9 Resp PCR: negative 4/9 MRSA: neg 4/9 BAL: no organisms   Thank you for allowing pharmacy to be a part of this patient's care.  Jimmy Footman, PharmD, BCPS PGY2 Infectious Diseases Pharmacy Resident Pager: [phone]  01/29/2018 4:37 PM

## 2018-01-29 NOTE — Progress Notes (Signed)
Pt displaying intermittent tachycardia, tachypnea, and difficulty tolerating vent.  [place] RN notified of Pts condition and the need for increased sedation.  PRN versed and fentanyl increased.  Will continue to monitor.

## 2018-01-30 ENCOUNTER — Inpatient Hospital Stay (HOSPITAL_COMMUNITY): Payer: Medicare Other

## 2018-01-30 DIAGNOSIS — R0602 Shortness of breath: Secondary | ICD-10-CM

## 2018-01-30 DIAGNOSIS — L8990 Pressure ulcer of unspecified site, unspecified stage: Secondary | ICD-10-CM

## 2018-01-30 LAB — CBC
HEMATOCRIT: 26.9 % — AB (ref 39.0–52.0)
HEMOGLOBIN: 8.7 g/dL — AB (ref 13.0–17.0)
MCH: 27.4 pg (ref 26.0–34.0)
MCHC: 32.3 g/dL (ref 30.0–36.0)
MCV: 84.6 fL (ref 78.0–100.0)
Platelets: 8 10*3/uL — CL (ref 150–400)
RBC: 3.18 MIL/uL — AB (ref 4.22–5.81)
RDW: 20.1 % — ABNORMAL HIGH (ref 11.5–15.5)
WBC: 12.4 10*3/uL — ABNORMAL HIGH (ref 4.0–10.5)

## 2018-01-30 LAB — BLOOD CULTURE ID PANEL (REFLEXED)
ACINETOBACTER BAUMANNII: NOT DETECTED
CANDIDA ALBICANS: NOT DETECTED
CANDIDA GLABRATA: NOT DETECTED
CANDIDA KRUSEI: NOT DETECTED
CANDIDA PARAPSILOSIS: NOT DETECTED
Candida tropicalis: NOT DETECTED
ENTEROBACTER CLOACAE COMPLEX: NOT DETECTED
ENTEROBACTERIACEAE SPECIES: NOT DETECTED
ESCHERICHIA COLI: NOT DETECTED
Enterococcus species: NOT DETECTED
Haemophilus influenzae: NOT DETECTED
KLEBSIELLA OXYTOCA: NOT DETECTED
Klebsiella pneumoniae: NOT DETECTED
Listeria monocytogenes: NOT DETECTED
Methicillin resistance: DETECTED — AB
Neisseria meningitidis: NOT DETECTED
PSEUDOMONAS AERUGINOSA: NOT DETECTED
Proteus species: NOT DETECTED
STREPTOCOCCUS PNEUMONIAE: NOT DETECTED
STREPTOCOCCUS PYOGENES: NOT DETECTED
Serratia marcescens: NOT DETECTED
Staphylococcus aureus (BCID): NOT DETECTED
Staphylococcus species: DETECTED — AB
Streptococcus agalactiae: NOT DETECTED
Streptococcus species: NOT DETECTED

## 2018-01-30 LAB — RENAL FUNCTION PANEL
ALBUMIN: 1.6 g/dL — AB (ref 3.5–5.0)
ANION GAP: 9 (ref 5–15)
BUN: 40 mg/dL — ABNORMAL HIGH (ref 6–20)
CO2: 23 mmol/L (ref 22–32)
Calcium: 7.3 mg/dL — ABNORMAL LOW (ref 8.9–10.3)
Chloride: 111 mmol/L (ref 101–111)
Creatinine, Ser: 1.04 mg/dL (ref 0.61–1.24)
GFR calc non Af Amer: >60 mL/min (ref >60)
GLUCOSE: 132 mg/dL — AB (ref 65–99)
PHOSPHORUS: 2.6 mg/dL (ref 2.5–4.6)
POTASSIUM: 4.3 mmol/L (ref 3.5–5.1)
Sodium: 143 mmol/L (ref 135–145)

## 2018-01-30 LAB — GLUCOSE, CAPILLARY
GLUCOSE-CAPILLARY: 136 mg/dL — AB (ref 65–99)
GLUCOSE-CAPILLARY: 166 mg/dL — AB (ref 65–99)
Glucose-Capillary: 116 mg/dL — ABNORMAL HIGH (ref 65–99)
Glucose-Capillary: 132 mg/dL — ABNORMAL HIGH (ref 65–99)
Glucose-Capillary: 140 mg/dL — ABNORMAL HIGH (ref 65–99)
Glucose-Capillary: 144 mg/dL — ABNORMAL HIGH (ref 65–99)

## 2018-01-30 LAB — MAGNESIUM: Magnesium: 2 mg/dL (ref 1.7–2.4)

## 2018-01-30 LAB — TRIGLYCERIDES: TRIGLYCERIDES: 86 mg/dL (ref <150)

## 2018-01-30 MED ORDER — MORPHINE 100MG IN NS 100ML (1MG/ML) PREMIX INFUSION
2.0000 mg/h | INTRAVENOUS | Status: DC
  Administered 2018-01-30: 2 mg/h via INTRAVENOUS
  Filled 2018-01-30: qty 100

## 2018-01-30 MED ORDER — MORPHINE BOLUS VIA INFUSION
2.0000 mg | INTRAVENOUS | Status: DC | PRN
  Administered 2018-01-30: 3 mg via INTRAVENOUS
  Administered 2018-01-30: 2 mg via INTRAVENOUS
  Administered 2018-01-31: 3 mg via INTRAVENOUS
  Administered 2018-02-02: 4 mg via INTRAVENOUS
  Filled 2018-01-30: qty 4

## 2018-01-30 MED ORDER — SODIUM CHLORIDE 0.9 % IV SOLN: Freq: Once | INTRAVENOUS | Status: AC

## 2018-01-30 NOTE — Progress Notes (Signed)
SLP Cancellation Note  Patient Details Name: Tommy Bentley MRN: [id number] DOB: 06-Nov-1950   Cancelled treatment:       Reason Eval/Treat Not Completed: Medical issues which prohibited therapy. Per chart review pt remains intubated/sedated with plans for likely one-way intubation. SLP will sign off for now. Please reorder if we can be of assistance.   Germain Osgood 01/30/2018, 8:21 AM  Germain Osgood, M.A. CCC-SLP [phone]

## 2018-01-30 NOTE — Progress Notes (Signed)
Pt remains on vent at this time at 40 percent. CSW continuing to follow for any further needs at this time.    Virgie Dad Marie Chow, MSW, [facility] Clinical Social Worker [phone]

## 2018-01-30 NOTE — Progress Notes (Signed)
Updated family at bedside, including family point of contact Hjuel (grand-niece). We discussed his worsening condition and that it is very unlikely he would be able to successfully extubate. Family understand that withdrawal of ventilatory support would be a terminal extubation and would like to inform and gather family for anticipated extubation this Wednesday, 02/01/18 at approximately 4:00 PM. They are agreeable to continued supportive care and temporizing measures and do not want further aggressive or invasive procedures (no trach, PEG). They understand that he may decompensate prior to Wednesday afternoon. For now we will continue the following plan of care : - Continue vent support - anticipate terminal extubation 02/01/18 at ~4:00pm - Continue DNR status - Morphine infusion started - Transfuse platelets and continue antibiotics for now - Will limit further blood draws/imaging - Update family with any clinical changes  Zada Finders, MD Internal Medicine PGY-3

## 2018-01-30 NOTE — Progress Notes (Addendum)
PULMONARY / CRITICAL CARE MEDICINE   Name: Tommy Bentley MRN: [id number] DOB: 21-Mar-1951    ADMISSION DATE:  02/12/2018  REFERRING MD:  Dr. Shan Levans ([facility])  CHIEF COMPLAINT:  Aphasia, bloody stool, and right sided weakness  Brief patient summary: 67 y.o [place] male with unknown med hx presented with aphasia, right weakness. Imaging sig for bilateral mca infarcts and right pca infarct. MRA with right carotid siphon stenosis. LE venous doppler negative for dvt. Labs sig for hb=3.5, plt=40 got 5u on admission. GI following, plan is to do EGD once stable. Onc on board thinks maybe mantle cell lymphoma, aml, or apl. Flow cytometry results consistent with AML and pathology review showed Leukoerythroblastic smear with shistocytes and NRBC's. Kappa lamda free light chains elevated, ratio normal.   STUDIES:  MRI(4/7):  1. Numerous small foci of acute/early subacute infarction are present throughout the left hemisphere, largely in a watershed distribution. 2. Late subacute to chronic hemorrhagic infarct in right occipital lobe. 3. No acute hemorrhage, mass effect, or herniation. 4. Background of moderate chronic microvascular ischemic changes and parenchymal volume loss of the brain.  CT head (4/7): Areas of low-density in the right occipital lobe and within the left hemispheric white matter worrisome for acute/subacute infarctions. No hemorrhage or mass effect.  MRA (4/7): 1. Left M2 superior division appears to have common origin with left A1 and is occluded proximally. 2. No additional large vessel occlusion of the intracranial circulation identified.  MRA neck: Severe motion artifact. Patent carotid systems and vertebral arteries. No definite evidence for hemodynamically significant stenosis by NASCET criteria.  CT abdomen/pelvis 4/7:negative for acute intra-abdominal processes   CXR 4/9:diffuse, bilateral airspace opacities, no focal consolidations or pleural effusions    CULTURES: RVP 4/9 - neg BAL 4/9 >> no growth BCx 4/14 >> Trach aspirate 4/14 >> mod gram neg rods, rare G+ cocci in pairs/rods  ANTIBIOTICS: Azithro 4/7 Ceftriaxone 4/7 Cefepime 4/8 >> 4/9 Vancomycin 4/14 >> Cefepime 4/14 >>  LINES/TUBES: PIV LUE ETT 4/9 CVC LIJ4/9  SIGNIFICANT EVENTS: 4/7 > admitted to [facility] with Lt sided CVA, Hgb 3.9 - transfused 4 units PRBCs 4/9 > admitted to ICU + bronchoscopy + intubation 4/11 > Code status changed to DNR 4/12 - patient is sedated and intubated. Family decided to change code status to DNR on 4/11. 4/13 - Ongoing alveolar hemorrhage via subglottic suction. Making urine. Not on pressors 4/14 - febrile, worsening infiltrates on CXR and leukocytosis - repeated BCx and started Vanc/Cefepime. Transfused 1 unit PRBC for Hgb 6.5.   SUBJECTIVE/OVERNIGHT/INTERVAL HX Hgb 6.5 4/14 pm, transfused 1 unit PRBC.  VITAL SIGNS: BP (!) 96/50   Pulse 87   Temp (!) 100.4 F (38 C) (Axillary) Comment: RN Robin aware   Resp (!) 27   Ht _0  (1.6 m)   Wt 105 lb 13.1 oz (48 kg)   SpO2 100%   BMI 18.75 kg/m   VENTILATOR SETTINGS: Vent Mode: PRVC FiO2 (%):  [40 %] 40 % Set Rate:  [16 bmp] 16 bmp Vt Set:  [340 mL] 340 mL PEEP:  [5 cmH20] 5 cmH20 Plateau Pressure:  [17 cmH20-20 cmH20] 19 cmH20  INTAKE / OUTPUT: I/O last 3 completed shifts: In: 2214.4 [I.V.:654.4; NG/GT:1260; IV Piggyback:300] Out: 8341 [DQQIW:9798]  PHYSICAL EXAMINATION: General: sedated HEENT: Intubated Cardiac: RRR, no rubs, murmurs or gallops Pulm: rhonchi anteriorly R > L Abd: soft, nondistended Ext: no pedal edema Neuro: sedated   PULMONARY Recent Labs  Lab 01/24/18 0135 01/24/18 0427 01/25/18 0342 01/26/18  7322 01/29/18 2049  PHART 7.410 7.347* 7.293* 7.306* 7.321*  PCO2ART 29.2* 40.8 40.7 45.3 46.4  PO2ART 56.3* 318.0* 99.0 155* 140.0*  HCO3 18.1* 22.4 19.6* 21.9 23.8  TCO2  --  24 21*  --  25  O2SAT 86.7 100.0 97.0 98.8 99.0    CBC Recent  Labs  Lab 01/29/18 1035 01/29/18 2038 01/30/18 0630  HGB 7.0* 6.5* 8.7*  HCT 22.6* 20.8* 26.9*  WBC 13.7* 12.1* 12.4*  PLT 30* 27* 8*    COAGULATION Recent Labs  Lab 01/24/18 0455  INR 1.32    CARDIAC  No results for input(s): TROPONINI in the last 168 hours. No results for input(s): PROBNP in the last 168 hours.   CHEMISTRY Recent Labs  Lab 01/26/18 0419 01/27/18 0316 01/27/18 1124 01/27/18 1614 01/28/18 0325 01/28/18 0500 01/28/18 1613 01/29/18 1035 01/30/18 0630  NA 144 140  --   --   --  138  --  140 143  K 3.5 3.5  --   --   --  3.6  --  3.9 4.3  CL 112* 112*  --   --   --  112*  --  110 111  CO2 21* 22  --   --   --  22  --  22 23  GLUCOSE 90 105*  --   --   --  169*  --  132* 132*  BUN 34* 30*  --   --   --  34*  --  39* 40*  CREATININE 2.18* 1.75*  --   --   --  1.46*  --  1.20 1.04  CALCIUM 8.0* 7.6*  --   --   --  7.6*  --  7.3* 7.3*  MG  --  2.2 2.1 2.0 2.0  --  2.0  --  2.0  PHOS  --  2.6 2.2* 2.5 1.8* 1.8* 2.7 2.3* 2.6   Estimated Creatinine Clearance: 47.4 mL/min (by C-G formula based on SCr of 1.04 mg/dL).   LIVER Recent Labs  Lab 01/24/18 0315 01/24/18 0455 01/27/18 0316 01/28/18 0500 01/29/18 1035 01/30/18 0630  AST 28  --   --   --   --   --   ALT 11*  --   --   --   --   --   ALKPHOS 58  --   --   --   --   --   BILITOT 2.7*  --   --   --   --   --   PROT 7.8  --   --   --   --   --   ALBUMIN 2.4*  --  1.6* 1.5* 1.6* 1.6*  INR  --  1.32  --   --   --   --      INFECTIOUS Recent Labs  Lab 01/24/18 0955 01/24/18 1448 01/25/18 0431 01/26/18 0419  LATICACIDVEN 1.3 1.3  --   --   PROCALCITON 1.27  --  1.46 1.62     ENDOCRINE CBG (last 3)  Recent Labs    01/30/18 0350 01/30/18 0743 01/30/18 1116  GLUCAP 116* 132* 140*      IMAGING x48h  - image(s) personally visualized  -   highlighted in bold Dg Chest Port 1 View  Result Date: 01/30/2018 CLINICAL DATA:  Endotracheally intubated. EXAM: PORTABLE CHEST 1 VIEW  COMPARISON:  01/28/2018 FINDINGS: The heart size is normal. Endotracheal tube is stable. A left IJ line is stable.  A small bore feeding tube courses off the inferior border of the film. Significant distention of the stomach is noted. The heart size is normal. Bilateral airspace disease is increasing. There is involvement of the right upper lobe and left lower lobe. Perihilar disease on the left has increased. IMPRESSION: 1. Progressive bilateral airspace disease, right greater than left. 2. Support apparatus is stable. 3. Progressive gastric distention. Electronically Signed   By: San Morelle M.D.   On: 01/30/2018 07:09     ASSESSMENT / PLAN:  PULMONARY A: Acute hypoxic respiratory failure - CXR with diffuse airspace disease, concern for blast crisis vs DAH vs TRALI  Bilateral pulmonary infiltrates - worsening Bronch 4/9 suggestive of pulmonary alveolar hemorrhage P: Vent support - wean/SBT - will be one way extubation May need diuresis  CARDIOVASCULAR A: HFpEF -(01/23/18) EF 55%, G2DD, mild MR P: Monitor volume status   RENAL A: AKI-likely prerenal from poor po intake +/- lasix diuresis P: Replete electrolytes as needed Monitor BMET/UOP/I&Os  GASTROINTESTINAL A: No active GI bleed Nutrition Gastric distension on CXR P: Tube feeds - may need to hold  HEMATOLOGIC A: AML - new dx this admit Microcytic anemia s/p 5u pRBCs Leukoerythroblastic reaction Thrombocytopenia - 8,000 P: Monitor CBC and CMP Monitor Hgb, signs/symptoms of further bleeding; transfuse for Hgb <7.0 Transfuse platelets  INFECTIOUS A: Immunocompromised in the setting of likely hematological malignancy Fevers/Leukocytosis/Worsening pulm infiltrates concerning for PNA RVP negative  BAL (4/9) no growth  Vanc/Cefepime 4/14 >> F/u BCx  ENDOCRINE A: Hypoglycemia. Off d10 . Doing well with TF P: - ssi  NEUROLOGIC A: L + R occipital lobe hemorrhagic strokes -  no tPA given, Neuro deficits include R facial droop, RUE weakness, and inability to follow commands. Pain/Agitation/Delirium P: -Holding antiplatelets and embolic workup in the setting of severe thrombocytopenia RASS goal:0, -1 -Precedex gtt - Morphine gtt - Fentanyl prn   FAMILY - Updates: Family updated 4/15 - Code status remains DNR. Plan for one-way extubation Wednesday, 02/01/18, around 4 pm to gather family. Will continue supportive and temporizing measure but no overt aggressive measures (no trach, no PEG).  Zada Finders, MD Internal Medicine PGY-3  01/30/2018 12:28 PM

## 2018-01-30 NOTE — Progress Notes (Signed)
PHARMACY - PHYSICIAN COMMUNICATION CRITICAL VALUE ALERT - BLOOD CULTURE IDENTIFICATION (BCID)  Tommy Bentley is an 67 y.o. male who presented to [facility] on 01/30/2018 with a chief complaint of aphasia, blood stool, R sided weakness.   Assessment:   67 yo M presents with Anticipating terminal extubation 4/17.Treating for PNA. 1/2 blood cx with staph species. Probable contaminant.  Name of physician (or Provider) Contacted: R Byrum   Current antibiotics: Vancomycin and cefepime  Changes to prescribed antibiotics recommended:  No changes needed, continue treatment for PNA and follow up [place]  Results for orders placed or performed during the hospital encounter of 01/18/2018  Blood Culture ID Panel (Reflexed) (Collected: 01/29/2018  5:25 PM)  Result Value Ref Range   Enterococcus species NOT DETECTED NOT DETECTED   Listeria monocytogenes NOT DETECTED NOT DETECTED   Staphylococcus species DETECTED (A) NOT DETECTED   Staphylococcus aureus NOT DETECTED NOT DETECTED   Methicillin resistance DETECTED (A) NOT DETECTED   Streptococcus species NOT DETECTED NOT DETECTED   Streptococcus agalactiae NOT DETECTED NOT DETECTED   Streptococcus pneumoniae NOT DETECTED NOT DETECTED   Streptococcus pyogenes NOT DETECTED NOT DETECTED   Acinetobacter baumannii NOT DETECTED NOT DETECTED   Enterobacteriaceae species NOT DETECTED NOT DETECTED   Enterobacter cloacae complex NOT DETECTED NOT DETECTED   Escherichia coli NOT DETECTED NOT DETECTED   Klebsiella oxytoca NOT DETECTED NOT DETECTED   Klebsiella pneumoniae NOT DETECTED NOT DETECTED   Proteus species NOT DETECTED NOT DETECTED   Serratia marcescens NOT DETECTED NOT DETECTED   Haemophilus influenzae NOT DETECTED NOT DETECTED   Neisseria meningitidis NOT DETECTED NOT DETECTED   Pseudomonas aeruginosa NOT DETECTED NOT DETECTED   Candida albicans NOT DETECTED NOT DETECTED   Candida glabrata NOT DETECTED NOT DETECTED   Candida krusei NOT DETECTED NOT DETECTED   Candida parapsilosis NOT DETECTED NOT DETECTED   Candida tropicalis NOT DETECTED NOT DETECTED    Kyanne Rials J 01/30/2018  5:02 PM

## 2018-01-30 NOTE — Progress Notes (Signed)
Pre blood admin vitals assessment pt w/ temp of 102.9. Dr. Tarri Abernethy informed, orders recvd to give PRN tylenol and monitor for improved temp before administering platelets. Platelets returned to Blood Bank until fever begins to resolve. Ice packs applied to groins and axilla.

## 2018-01-30 NOTE — Progress Notes (Signed)
Daily Progress Note   Patient Name: Tommy Bentley       Date: 01/30/2018 DOB: 04/30/1951  Age: 67 y.o. MRN#: [id number] Attending Physician: Brand Males, MD Primary Care Physician: Patient, No Pcp Per Admit Date: 02/08/2018  Reason for Consultation/Follow-up: Establishing goals of care  Subjective: Tommy Bentley continues to be critically ill and sustaining on vent. Unable to communicate and does not follow commands or make eye contact.   Length of Stay: 7  Current Medications: Scheduled Meds:  . chlorhexidine gluconate (MEDLINE KIT)  15 mL Mouth Rinse BID  . Chlorhexidine Gluconate Cloth  6 each Topical Daily  . folic acid  1 mg Per Tube Daily  . insulin aspart  0-9 Units Subcutaneous Q4H  . mouth rinse  15 mL Mouth Rinse 10 times per day  . pantoprazole sodium  40 mg Per Tube Daily  . sodium chloride flush  10-40 mL Intracatheter Q12H  . sodium chloride flush  3 mL Intravenous Q12H  . thiamine  100 mg Per Tube Daily    Continuous Infusions: . sodium chloride 10 mL/hr at 01/29/18 0700  . sodium chloride    . ceFEPime (MAXIPIME) IV Stopped (01/29/18 1828)  . dexmedetomidine 1.6 mcg/kg/hr (01/30/18 1000)  . feeding supplement (VITAL AF 1.2 CAL) 45 mL/hr at 01/30/18 1000  . morphine    . vancomycin      PRN Meds: acetaminophen **OR** acetaminophen, fentaNYL (SUBLIMAZE) injection, ipratropium-albuterol, morphine, ondansetron **OR** ondansetron (ZOFRAN) IV, senna-docusate, sodium chloride flush  Physical Exam  Constitutional: He appears well-developed. He has a sickly appearance. He appears distressed. He is intubated.  HENT:  Head: Normocephalic and atraumatic.  Cardiovascular: Normal rate and regular rhythm.  Pulmonary/Chest: Accessory muscle usage present. Tachypnea noted. He is  intubated. He is in respiratory distress.  Abdominal: Normal appearance.  Neurological: He is unresponsive.  Opens eyes but not to command and no tracking  Nursing note and vitals reviewed.           Vital Signs: BP (!) 96/50   Pulse 87   Temp (!) 100.4 F (38 C) (Axillary) Comment: RN Tommy Bentley aware   Resp (!) 27   Ht '5\' 3"'  (1.6 m)   Wt 48 kg (105 lb 13.1 oz)   SpO2 100%   BMI 18.75 kg/m  SpO2: SpO2: 100 %  O2 Device: O2 Device: Ventilator O2 Flow Rate: O2 Flow Rate (L/min): 40 L/min  Intake/output summary:   Intake/Output Summary (Last 24 hours) at 01/30/2018 1151 Last data filed at 01/30/2018 0900 Gross per 24 hour  Intake 1131.39 ml  Output 1177 ml  Net -45.61 ml   LBM: Last BM Date: 01/24/18 Baseline Weight: Weight: 59 kg (130 lb) Most recent weight: Weight: 48 kg (105 lb 13.1 oz)       Palliative Assessment/Data: 10%    Flowsheet Rows     Most Recent Value  Intake Tab  Referral Department  Critical care  Unit at Time of Referral  ICU  Palliative Care Primary Diagnosis  Neurology  Date Notified  01/26/18  Palliative Care Type  New Palliative care  Reason for referral  Clarify Goals of Care  Date of Admission  01/27/2018  Date first seen by Palliative Care  01/27/18  # of days Palliative referral response time  1 Day(s)  # of days IP prior to Palliative referral  4  Clinical Assessment  Palliative Performance Scale Score  20%  Pain Max last 24 hours  Not able to report  Pain Min Last 24 hours  Not able to report  Dyspnea Max Last 24 Hours  Not able to report  Dyspnea Min Last 24 hours  Not able to report  Nausea Max Last 24 Hours  Not able to report  Nausea Min Last 24 Hours  Not able to report  Anxiety Max Last 24 Hours  Not able to report  Anxiety Min Last 24 Hours  Not able to report  Other Max Last 24 Hours  Not able to report  Psychosocial & Spiritual Assessment  Palliative Care Outcomes  Patient/Family meeting held?  Yes  Who was at the meeting?   great niece,  FU sch for 4/13 with other family  Palliative Care follow-up planned  Yes, Facility      Patient Active Problem List   Diagnosis Date Noted  . Goals of care, counseling/discussion   . Palliative care by specialist   . Endotracheally intubated   . Hemoptysis   . AKI (acute kidney injury) ([place])   . Acute myeloid leukemia not having achieved remission ([place])   . Acute respiratory failure with hypoxia ([place])   . Lymphocytosis   . Leukoerythroblastotic reaction   . Acute ischemic stroke ([place]) 01/23/2018  . Protein-calorie malnutrition, severe 01/23/2018  . Anemia   . Thrombocytopenia ([place])   . Smoker   . PVD (peripheral vascular disease) ([place])   . Stenosis of intracranial portion of both internal carotid arteries   . Gastrointestinal hemorrhage     Palliative Care Assessment & Plan   HPI: 67 y.o.malewith little knownpast medical history admitted on4/7/2019with right-sided weakness, fatigue and aphasia. He was found to have had an acute left MCA infarct as well as subacute right hemorrhagic infarcts. Patient had had the flu 3 weeks prior to admission. Upon admission he was found to have a critically low hemoglobin of 3.5, platelets 43 and received transfusion. Patient has been seen by oncological services secondary to profound anemia, thrombocytopenia and lymphocytosis and found to have likely AML.    Assessment: I met today at Tommy Bentley bedside with his 2 great nieces, Tommy Bentley and Tommy Bentley, as well as their cousing and then their brother joined [place]. Tommy Bentley was the most engaging to me and shares that they have a large and close family but his siblings are still in [place] and they are the  closest family here in the [place]. Tommy Bentley is not even aware of what part of [place] Tommy Bentley is from and says "I have always lived here" and that Tommy Bentley has been here for a long time as well.   We had a long discussion that Tommy Bentley is unfortunately not making any progress and is in fact declining  on vent. I reiterated the conversations with Tommy Roch, NP palliative care over the weekend and that we are likely nearing the point that the aggressive interventions such as the vent are causing more suffering and not helping [place] to get him better. I encouraged them to begin to prepare themselves that we will likely be going done the path of removing the tubes if the goal is to keep him comfortable. When I asked them about questions Tommy Bentley shares that she feels guilty asking the questions that she has. She fears that asking these questions means that they are giving up on him. She tells me that she will count on the doctors to answer these questions for her without her having to ask. I shared that the doctors plan to speak with her and that I feel the recommendation will be to focus on comfort care and begin planning to remove life support and letting him pass away peacefully and naturally.   I also introduced the idea of an infusion for comfort and family agrees as they do not want to see him suffer and main goal is to keep him comfortable.   Recommendations/Plan:  Morphine infusion 2 mg/hr with 2-4 mg IV bolus every hour prn pain/dyspnea.   Discussed plan with Dr. Posey Pronto and Dr. Lamonte Sakai that will follow up with recommendations for family regarding transition to comfort care. I did prepare the family that this would likely be the recommendation for full comfort and remove ETT/vent.   Goals of Care and Additional Recommendations:  Limitations on Scope of Treatment: No escalation of care.   Code Status:  DNR  Prognosis:   Hours - Days without vent and with transition to comfort care.   Discharge Planning:  Anticipated Hospital Death  Care plan was discussed with Dr. Posey Pronto and Dr. Lamonte Sakai, RN Shirlean Mylar.   Thank you for allowing the Palliative Medicine Team to assist in the care of this patient.   Total Time 35 min Prolonged Time Billed  no       Greater than 50%  of this time was spent counseling  and coordinating care related to the above assessment and plan.  Vinie Sill, NP Palliative Medicine Team Pager # [phone] (M-F 8a-5p) Team Phone # [phone] (Nights/Weekends)

## 2018-01-31 DIAGNOSIS — R0602 Shortness of breath: Secondary | ICD-10-CM

## 2018-01-31 LAB — PREPARE PLATELET PHERESIS
UNIT DIVISION: 0
Unit division: 0

## 2018-01-31 LAB — BPAM PLATELET PHERESIS
BLOOD PRODUCT EXPIRATION DATE: 201904152359
Blood Product Expiration Date: 201904172359
ISSUE DATE / TIME: 201904151546
ISSUE DATE / TIME: 201904152338
UNIT TYPE AND RH: 6200
Unit Type and Rh: 6200

## 2018-01-31 LAB — GLUCOSE, CAPILLARY
GLUCOSE-CAPILLARY: 110 mg/dL — AB (ref 65–99)
GLUCOSE-CAPILLARY: 115 mg/dL — AB (ref 65–99)
GLUCOSE-CAPILLARY: 120 mg/dL — AB (ref 65–99)
Glucose-Capillary: 126 mg/dL — ABNORMAL HIGH (ref 65–99)
Glucose-Capillary: 139 mg/dL — ABNORMAL HIGH (ref 65–99)
Glucose-Capillary: 132 mg/dL — ABNORMAL HIGH (ref 65–99)

## 2018-01-31 LAB — CULTURE, RESPIRATORY W GRAM STAIN

## 2018-01-31 LAB — CULTURE, RESPIRATORY

## 2018-01-31 LAB — CBC
HEMATOCRIT: 21.5 % — AB (ref 39.0–52.0)
Hemoglobin: 6.9 g/dL — CL (ref 13.0–17.0)
MCH: 27.5 pg (ref 26.0–34.0)
MCHC: 32.1 g/dL (ref 30.0–36.0)
MCV: 85.7 fL (ref 78.0–100.0)
Platelets: 61 10*3/uL — ABNORMAL LOW (ref 150–400)
RBC: 2.51 MIL/uL — ABNORMAL LOW (ref 4.22–5.81)
RDW: 21 % — AB (ref 11.5–15.5)
WBC: 7.8 10*3/uL (ref 4.0–10.5)

## 2018-01-31 LAB — PREPARE RBC (CROSSMATCH)

## 2018-01-31 MED ORDER — SODIUM CHLORIDE 0.9 % IV SOLN
Freq: Once | INTRAVENOUS | Status: AC
  Administered 2018-01-31: 07:00:00 via INTRAVENOUS

## 2018-01-31 MED ORDER — MORPHINE 100MG IN NS 100ML (1MG/ML) PREMIX INFUSION
0.0000 mg/h | INTRAVENOUS | Status: DC
  Administered 2018-01-31: 5 mg/h via INTRAVENOUS
  Administered 2018-01-31: 3 mg/h via INTRAVENOUS
  Administered 2018-02-01: 10 mg/h via INTRAVENOUS
  Administered 2018-02-02: 15 mg/h via INTRAVENOUS
  Administered 2018-02-02: 10 mg/h via INTRAVENOUS
  Administered 2018-02-02: 4 mg/h via INTRAVENOUS
  Filled 2018-01-31 (×7): qty 100

## 2018-01-31 NOTE — Progress Notes (Signed)
Post transfusion H&H not drawn per verbal orders Dr. Posey Pronto.

## 2018-01-31 NOTE — Progress Notes (Signed)
CRITICAL VALUE ALERT  Critical Value:  Hemoglobin 6.9  Date & Time Notied: 01/31/18 0605  Provider Notified: Bos[place] MD   Orders Received/Actions taken: Orders received for blood

## 2018-01-31 NOTE — Progress Notes (Signed)
Palliative:  [name] appears much more comfortable today with morphine infusion. RN increased to 3 mg/hr and says he is much improved. He does open eyes and makes eye contact but not following commands. He received blood this morning. Still bleeding from lungs.   I called and spoke with niece, Tommy Bentley. Tommy Bentley has no questions and says she has explained to all their family why Tommy Bentley is not improving and why comfort is recommended. She confirms plan for transition to comfort care tomorrow 4pm. All questions/concerns addressed. Emotional support provided.   15 min  Vinie Sill, NP Palliative Medicine Team Pager # [phone] (M-F 8a-5p) Team Phone # [phone] (Nights/Weekends)

## 2018-01-31 NOTE — Progress Notes (Addendum)
PULMONARY / CRITICAL CARE MEDICINE   Name: Tommy Bentley MRN: [id number] DOB: 24-Dec-1950    ADMISSION DATE:  01/18/2018  REFERRING MD:  Dr. Shan Levans ([facility])  CHIEF COMPLAINT:  Aphasia, bloody stool, and right sided weakness  Brief patient summary: 67 y.o [place] male with unknown med hx presented with aphasia, right weakness. Imaging sig for bilateral mca infarcts and right pca infarct. MRA with right carotid siphon stenosis. LE venous doppler negative for dvt. Labs sig for hb=3.5, plt=40 got 5u on admission. Bronch consistent with alveolar hemorrhage. Flow cytometry results consistent with AML and pathology review showed Leukoerythroblastic smear with shistocytes and NRBC's.   STUDIES:  MRI(4/7):  1. Numerous small foci of acute/early subacute infarction are present throughout the left hemisphere, largely in a watershed distribution. 2. Late subacute to chronic hemorrhagic infarct in right occipital lobe. 3. No acute hemorrhage, mass effect, or herniation. 4. Background of moderate chronic microvascular ischemic changes and parenchymal volume loss of the brain.  CT head (4/7): Areas of low-density in the right occipital lobe and within the left hemispheric white matter worrisome for acute/subacute infarctions. No hemorrhage or mass effect.  MRA (4/7): 1. Left M2 superior division appears to have common origin with left A1 and is occluded proximally. 2. No additional large vessel occlusion of the intracranial circulation identified.  MRA neck: Severe motion artifact. Patent carotid systems and vertebral arteries. No definite evidence for hemodynamically significant stenosis by NASCET criteria.  CT abdomen/pelvis 4/7:negative for acute intra-abdominal processes   CXR 4/9:diffuse, bilateral airspace opacities, no focal consolidations or pleural effusions   CULTURES: RVP 4/9 - neg BAL 4/9 >> no growth BCx 4/14 >> coag neg staph aureus Trach aspirate 4/14 >> mod gram  neg rods, rare G+ cocci in pairs/rods  ANTIBIOTICS: Azithro 4/7 Ceftriaxone 4/7 Cefepime 4/8 >> 4/9 Vancomycin 4/14 >> Cefepime 4/14 >>  LINES/TUBES: PIV LUE ETT 4/9 CVC LIJ4/9  SIGNIFICANT EVENTS: 4/7 > admitted to IMTS with Lt sided CVA, Hgb 3.9 - transfused 4 units PRBCs 4/9 > admitted to ICU + bronchoscopy + intubation 4/11 > Code status changed to DNR 4/12 - patient is sedated and intubated. Family decided to change code status to DNR on 4/11. 4/13 - Ongoing alveolar hemorrhage via subglottic suction. Making urine. Not on pressors 4/14 - febrile, worsening infiltrates on CXR and leukocytosis - repeated BCx and started Vanc/Cefepime. Transfused 1 unit PRBC for Hgb 6.5.   SUBJECTIVE/OVERNIGHT/INTERVAL HX Hgb 6.9, 1 unit PRBCs transfusion ordered overnight. Family decided for withdrawal of care 4/17.  VITAL SIGNS: BP (!) 151/70   Pulse 83   Temp (!) 101.8 F (38.8 C) (Oral)   Resp 16   Ht _0  (1.6 m)   Wt 108 lb 11 oz (49.3 kg)   SpO2 100%   BMI 19.25 kg/m   VENTILATOR SETTINGS: Vent Mode: PRVC FiO2 (%):  [40 %] 40 % Set Rate:  [16 bmp] 16 bmp Vt Set:  [340 mL-350 mL] 350 mL PEEP:  [5 cmH20] 5 cmH20 Plateau Pressure:  [17 cmH20-19 cmH20] 18 cmH20  INTAKE / OUTPUT: I/O last 3 completed shifts: In: 1989 [I.V.:574; Blood:245; NG/GT:720; IV Piggyback:450] Out: 2085 [Urine:2085]  PHYSICAL EXAMINATION: General: sedated but opens eyes HEENT: Intubated Cardiac: RRR, no rubs, murmurs or gallops Pulm: rhonchi anteriorly R > L Abd: soft, nondistended Ext: no pedal edema Neuro: sedated, opens eyes, moves LUE spontaneously   PULMONARY Recent Labs  Lab 01/25/18 0342 01/26/18 0322 01/29/18 2049  PHART 7.293* 7.306* 7.321*  PCO2ART 40.7  45.3 46.4  PO2ART 99.0 155* 140.0*  HCO3 19.6* 21.9 23.8  TCO2 21*  --  25  O2SAT 97.0 98.8 99.0    CBC Recent Labs  Lab 01/29/18 2038 01/30/18 0630 01/31/18 0441  HGB 6.5* 8.7* 6.9*  HCT 20.8* 26.9* 21.5*   WBC 12.1* 12.4* 7.8  PLT 27* 8* 61*    COAGULATION No results for input(s): INR in the last 168 hours.  CARDIAC  No results for input(s): TROPONINI in the last 168 hours. No results for input(s): PROBNP in the last 168 hours.   CHEMISTRY Recent Labs  Lab 01/26/18 0419 01/27/18 0316 01/27/18 1124 01/27/18 1614 01/28/18 0325 01/28/18 0500 01/28/18 1613 01/29/18 1035 01/30/18 0630  NA 144 140  --   --   --  138  --  140 143  K 3.5 3.5  --   --   --  3.6  --  3.9 4.3  CL 112* 112*  --   --   --  112*  --  110 111  CO2 21* 22  --   --   --  22  --  22 23  GLUCOSE 90 105*  --   --   --  169*  --  132* 132*  BUN 34* 30*  --   --   --  34*  --  39* 40*  CREATININE 2.18* 1.75*  --   --   --  1.46*  --  1.20 1.04  CALCIUM 8.0* 7.6*  --   --   --  7.6*  --  7.3* 7.3*  MG  --  2.2 2.1 2.0 2.0  --  2.0  --  2.0  PHOS  --  2.6 2.2* 2.5 1.8* 1.8* 2.7 2.3* 2.6   Estimated Creatinine Clearance: 48.7 mL/min (by C-G formula based on SCr of 1.04 mg/dL).   LIVER Recent Labs  Lab 01/27/18 0316 01/28/18 0500 01/29/18 1035 01/30/18 0630  ALBUMIN 1.6* 1.5* 1.6* 1.6*     INFECTIOUS Recent Labs  Lab 01/24/18 0955 01/24/18 1448 01/25/18 0431 01/26/18 0419  LATICACIDVEN 1.3 1.3  --   --   PROCALCITON 1.27  --  1.46 1.62     ENDOCRINE CBG (last 3)  Recent Labs    01/30/18 2010 01/31/18 0005 01/31/18 0339  GLUCAP 166* 110* 126*      IMAGING x48h  - image(s) personally visualized  -   highlighted in bold Dg Chest Port 1 View  Result Date: 01/30/2018 CLINICAL DATA:  Nasogastric and endotracheal tube placement. EXAM: PORTABLE CHEST 1 VIEW COMPARISON:  Chest radiograph performed earlier today at 4:58 a.m. FINDINGS: The patient's endotracheal tube is seen ending 1-2 cm above the carina. This could be retracted 2 cm. The patient's enteric tube is noted extending below the diaphragm. The stomach is distended with air. A left IJ line is noted ending about the distal SVC.  Worsening bilateral airspace opacification is concerning for worsening pneumonia. No significant pleural effusion or pneumothorax is seen. The cardiomediastinal silhouette is mildly enlarged. No acute osseous abnormalities are identified. IMPRESSION: 1. Endotracheal tube seen ending 1-2 cm above the carina. This could be retracted 2 cm. 2. Enteric tube noted extending below the diaphragm. Stomach is distended with air. 3. Worsening bilateral pneumonia noted. 4. Mild cardiomegaly. Electronically Signed   By: Garald Balding M.D.   On: 01/30/2018 22:14   Dg Chest Port 1 View  Result Date: 01/30/2018 CLINICAL DATA:  Endotracheally intubated. EXAM: PORTABLE CHEST 1 VIEW  COMPARISON:  01/28/2018 FINDINGS: The heart size is normal. Endotracheal tube is stable. A left IJ line is stable. A small bore feeding tube courses off the inferior border of the film. Significant distention of the stomach is noted. The heart size is normal. Bilateral airspace disease is increasing. There is involvement of the right upper lobe and left lower lobe. Perihilar disease on the left has increased. IMPRESSION: 1. Progressive bilateral airspace disease, right greater than left. 2. Support apparatus is stable. 3. Progressive gastric distention. Electronically Signed   By: San Morelle M.D.   On: 01/30/2018 07:09     ASSESSMENT / PLAN:  PULMONARY A: Acute hypoxic respiratory failure - CXR with diffuse airspace disease, concern for blast crisis vs DAH vs TRALI  Bilateral pulmonary infiltrates - worsening Bronch 4/9 suggestive of pulmonary alveolar hemorrhage P: Vent support - will be one way extubation 4/17  CARDIOVASCULAR A: HFpEF -(01/23/18) EF 55%, G2DD, mild MR P: Monitor volume status   RENAL A: AKI-likely prerenal from poor po intake +/- lasix diuresis vs contrast induced P: Limit blood draws  GASTROINTESTINAL A: No active GI bleed Nutrition Gastric distension on CXR P: Tube  feeds  HEMATOLOGIC A: AML - new dx this admit Microcytic anemia s/p 5u pRBCs Leukoerythroblastic reaction Thrombocytopenia - 8,000 >> 61k after platelet transfusion P: Transfused 1 unit PRBC overnight for Hgb 6.9 Limit blood draws  INFECTIOUS A: Immunocompromised in the setting of likely hematological malignancy Fevers/Leukocytosis/Worsening pulm infiltrates concerning for PNA RVP negative  BAL (4/9) no growth  Vanc/Cefepime 4/14 >>  ENDOCRINE A: Hypoglycemia. Off d10 . Doing well with TF P: - ssi  NEUROLOGIC A: L + R occipital lobe hemorrhagic strokes - no tPA given, Neuro deficits include R facial droop, RUE weakness, and inability to follow commands. Pain/Agitation/Delirium P: -Holding antiplatelets and embolic workup in the setting of severe thrombocytopenia RASS goal:0, -1 -Precedex gtt - Morphine gtt - Fentanyl prn   FAMILY - Updates: Family updated 4/16 - Code status remains DNR. Plan for one-way extubation Wednesday, 02/01/18, around 4 pm to gather family. Will continue supportive and temporizing measure but no overt aggressive measures (no trach, no PEG).  Zada Finders, MD Internal Medicine PGY-3  01/31/2018 8:10 AM

## 2018-02-01 LAB — CULTURE, BLOOD (ROUTINE X 2): Special Requests: ADEQUATE

## 2018-02-01 LAB — GLUCOSE, CAPILLARY
GLUCOSE-CAPILLARY: 127 mg/dL — AB (ref 65–99)
GLUCOSE-CAPILLARY: 105 mg/dL — AB (ref 65–99)
Glucose-Capillary: 114 mg/dL — ABNORMAL HIGH (ref 65–99)
Glucose-Capillary: 116 mg/dL — ABNORMAL HIGH (ref 65–99)

## 2018-02-01 MED ORDER — GLYCOPYRROLATE 0.2 MG/ML IJ SOLN
0.4000 mg | INTRAMUSCULAR | Status: AC
  Administered 2018-02-01: 0.4 mg via INTRAVENOUS
  Filled 2018-02-01: qty 2

## 2018-02-01 MED ORDER — POLYVINYL ALCOHOL 1.4 % OP SOLN: 1.0000 [drp] | Freq: Four times a day (QID) | OPHTHALMIC | Status: DC | PRN

## 2018-02-01 MED ORDER — BIOTENE DRY MOUTH MT LIQD: 15.0000 mL | OROMUCOSAL | Status: DC | PRN

## 2018-02-01 MED ORDER — GLYCOPYRROLATE 0.2 MG/ML IJ SOLN
0.4000 mg | INTRAMUSCULAR | Status: DC
  Administered 2018-02-01 – 2018-02-02 (×7): 0.4 mg via INTRAVENOUS
  Filled 2018-02-01 (×11): qty 2

## 2018-02-01 MED ORDER — LORAZEPAM 2 MG/ML IJ SOLN
1.0000 mg | INTRAMUSCULAR | Status: DC | PRN
  Administered 2018-02-01 – 2018-02-02 (×2): 2 mg via INTRAVENOUS
  Filled 2018-02-01 (×2): qty 1

## 2018-02-01 MED ORDER — GLYCOPYRROLATE 0.2 MG/ML IJ SOLN: 0.2000 mg | INTRAMUSCULAR | Status: DC | PRN

## 2018-02-01 NOTE — Progress Notes (Signed)
Daily Progress Note   Patient Name: Tommy Bentley       Date: 02/01/2018 DOB: May 27, 1951  Age: 67 y.o. MRN#: [id number] Attending Physician: Brand Males, MD Primary Care Physician: Patient, No Pcp Per Admit Date: 01/18/2018  Reason for Consultation/Follow-up: Establishing goals of care, Psychosocial/spiritual support, Terminal Care and Withdrawal of life-sustaining treatment  Subjective: Mr. Trouten still on vent, minimally responsive. Family in waiting room came to bedside for one way extubation.   Length of Stay: 9  Current Medications: Scheduled Meds:  . chlorhexidine gluconate (MEDLINE KIT)  15 mL Mouth Rinse BID  . Chlorhexidine Gluconate Cloth  6 each Topical Daily  . mouth rinse  15 mL Mouth Rinse 10 times per day  . pantoprazole sodium  40 mg Per Tube Daily  . sodium chloride flush  10-40 mL Intracatheter Q12H  . sodium chloride flush  3 mL Intravenous Q12H  . thiamine  100 mg Per Tube Daily    Continuous Infusions: . sodium chloride 10 mL/hr at 02/01/18 0600  . dexmedetomidine 1.6 mcg/kg/hr (02/01/18 0900)  . feeding supplement (VITAL AF 1.2 CAL) 45 mL/hr at 02/01/18 0600  . morphine 10 mg/hr (02/01/18 1246)    PRN Meds: acetaminophen **OR** acetaminophen, ipratropium-albuterol, morphine, ondansetron **OR** ondansetron (ZOFRAN) IV, senna-docusate, sodium chloride flush  Physical Exam  Constitutional: He has a sickly appearance. He appears ill.  Thin, frail  Cardiovascular: Normal rate.  Pulmonary/Chest:  Audible secretions/congestion Apnea noted  Abdominal: Normal appearance.  Neurological: He is unresponsive.  Nursing note and vitals reviewed.           Vital Signs: BP 134/62 (BP Location: Right Arm)   Pulse 83   Temp (!) 101.1 F (38.4 C) (Oral)   Resp (!) 9    Ht _0  (1.6 m)   Wt 50 kg (110 lb 3.7 oz)   SpO2 100%   BMI 19.53 kg/m  SpO2: SpO2: 100 % O2 Device: O2 Device: Ventilator O2 Flow Rate: O2 Flow Rate (L/min): 40 L/min  Intake/output summary:   Intake/Output Summary (Last 24 hours) at 02/01/2018 1335 Last data filed at 02/01/2018 0952 Gross per 24 hour  Intake 1579.5 ml  Output 1055 ml  Net 524.5 ml   LBM: Last BM Date: 02/01/18 Baseline Weight: Weight: 59 kg (130 lb) Most recent weight: Weight: 50  kg (110 lb 3.7 oz)       Palliative Assessment/Data: 20%    Flowsheet Rows     Most Recent Value  Intake Tab  Referral Department  Critical care  Unit at Time of Referral  ICU  Palliative Care Primary Diagnosis  Neurology  Date Notified  01/26/18  Palliative Care Type  New Palliative care  Reason for referral  Clarify Goals of Care  Date of Admission  01/30/2018  Date first seen by Palliative Care  01/27/18  # of days Palliative referral response time  1 Day(s)  # of days IP prior to Palliative referral  4  Clinical Assessment  Palliative Performance Scale Score  20%  Pain Max last 24 hours  Not able to report  Pain Min Last 24 hours  Not able to report  Dyspnea Max Last 24 Hours  Not able to report  Dyspnea Min Last 24 hours  Not able to report  Nausea Max Last 24 Hours  Not able to report  Nausea Min Last 24 Hours  Not able to report  Anxiety Max Last 24 Hours  Not able to report  Anxiety Min Last 24 Hours  Not able to report  Other Max Last 24 Hours  Not able to report  Psychosocial & Spiritual Assessment  Palliative Care Outcomes  Patient/Family meeting held?  Yes  Who was at the meeting?  great niece,  FU sch for 4/13 with other family  Palliative Care follow-up planned  Yes, Facility      Patient Active Problem List   Diagnosis Date Noted  . Shortness of breath   . Pressure injury of skin 01/30/2018  . Goals of care, counseling/discussion   . Palliative care by specialist   . Endotracheally intubated    . Hemoptysis   . AKI (acute kidney injury) ([place])   . Acute myeloid leukemia not having achieved remission ([place])   . Acute respiratory failure with hypoxia ([place])   . Lymphocytosis   . Leukoerythroblastotic reaction   . Acute ischemic stroke ([place]) 01/23/2018  . Protein-calorie malnutrition, severe 01/23/2018  . Anemia   . Thrombocytopenia ([place])   . Smoker   . PVD (peripheral vascular disease) ([place])   . Stenosis of intracranial portion of both internal carotid arteries   . Gastrointestinal hemorrhage     Palliative Care Assessment & Plan   HPI: 67 y.o.malewith little knownpast medical history admitted on4/7/2019with right-sided weakness, fatigue and aphasia. He was found to have had an acute left MCA infarct as well as subacute right hemorrhagic infarcts. Patient had had the flu 3 weeks prior to admission. Upon admission he was found to have a critically low hemoglobin of 3.5, platelets 43 and received transfusion. Patient has been seen by oncological services secondary to profound anemia, thrombocytopenia and lymphocytosis and found to have likely AML.    Assessment: I met again today with multiple family members including 2 nieces and the 2 great nieces that have been the main contact and support for Mr. Muldrow. They are prepared to extubate at this time. They chose to be at bedside during extubation. I did explain that his breathing would look differently and that he can hear them but would be unable to respond back. RN and RT at bedside for extubation. Bolus with morphine as well as ativan prior to extubation. He appears comfortable on morphine infusion. Family grieving and holding vigil at bedside. Emotional support provided and explained to family that he is breathing on his own  but breathing is slowing. He is actively dying. More stable than expected but unlikely that he will stabilize enough to transfer out of ICU.   Recommendations/Plan:  Morphine infusion with prn bolus.     Robinul scheduled for secretions.   Ativan prn for anxiety/agitaiton.   Full comfort care.   Goals of Care and Additional Recommendations:  Limitations on Scope of Treatment: Full Comfort Care  Code Status:  DNR  Prognosis:   Hours - Days  Discharge Planning:  Anticipated Hospital Death  Care plan was discussed with RN, PCCM  Thank you for allowing the Palliative Medicine Team to assist in the care of this patient.   Total Time 45 min Prolonged Time Billed  no       Greater than 50%  of this time was spent counseling and coordinating care related to the above assessment and plan.  Vinie Sill, NP Palliative Medicine Team Pager # [phone] (M-F 8a-5p) Team Phone # [phone] (Nights/Weekends)

## 2018-02-01 NOTE — Progress Notes (Signed)
CSW remains involved for pt and family at this time. CSW aware that per notes plan is for pt to transition to comfort care today around 4pm. CSW continuing to follow for any further CSW needs at this time.   Virgie Dad Jeanmarc Viernes, MSW, [facility] Clinical Social Worker [phone]

## 2018-02-01 NOTE — Progress Notes (Signed)
PULMONARY / CRITICAL CARE MEDICINE   Name: Tommy Bentley MRN: [id number] DOB: 05/05/1951    ADMISSION DATE:  02/05/2018  REFERRING MD:  Dr. Shan Levans ([facility])  CHIEF COMPLAINT:  Aphasia, bloody stool, and right sided weakness  Brief patient summary: 67 y.o [place] male with unknown med hx presented with aphasia, right weakness. Imaging sig for bilateral mca infarcts and right pca infarct. MRA with right carotid siphon stenosis. LE venous doppler negative for dvt. Labs sig for hb=3.5, plt=40 got 5u on admission. Bronch consistent with alveolar hemorrhage. Flow cytometry results consistent with AML and pathology review showed Leukoerythroblastic smear with shistocytes and NRBC's.   STUDIES:  MRI(4/7):  1. Numerous small foci of acute/early subacute infarction are present throughout the left hemisphere, largely in a watershed distribution. 2. Late subacute to chronic hemorrhagic infarct in right occipital lobe. 3. No acute hemorrhage, mass effect, or herniation. 4. Background of moderate chronic microvascular ischemic changes and parenchymal volume loss of the brain.  CT head (4/7): Areas of low-density in the right occipital lobe and within the left hemispheric white matter worrisome for acute/subacute infarctions. No hemorrhage or mass effect.  MRA (4/7): 1. Left M2 superior division appears to have common origin with left A1 and is occluded proximally. 2. No additional large vessel occlusion of the intracranial circulation identified.  MRA neck: Severe motion artifact. Patent carotid systems and vertebral arteries. No definite evidence for hemodynamically significant stenosis by NASCET criteria.  CT abdomen/pelvis 4/7:negative for acute intra-abdominal processes   CXR 4/9:diffuse, bilateral airspace opacities, no focal consolidations or pleural effusions   CULTURES: RVP 4/9 - neg BAL 4/9 >> no growth BCx 4/14 >> coag neg staph aureus Trach aspirate 4/14 >>  enterobacter cloacae  ANTIBIOTICS: Azithro 4/7 Ceftriaxone 4/7 Cefepime 4/8 >> 4/9 Vancomycin 4/14 >> Cefepime 4/14 >>  LINES/TUBES: PIV LUE ETT 4/9 CVC LIJ4/9  SIGNIFICANT EVENTS: 4/7 > admitted to IMTS with Lt sided CVA, Hgb 3.9 - transfused 4 units PRBCs 4/9 > admitted to ICU + bronchoscopy + intubation 4/11 > Code status changed to DNR 4/12 - patient is sedated and intubated. Family decided to change code status to DNR on 4/11. 4/13 - Ongoing alveolar hemorrhage via subglottic suction. Making urine. Not on pressors 4/14 - febrile, worsening infiltrates on CXR and leukocytosis - repeated BCx and started Vanc/Cefepime. Transfused 1 unit PRBC for Hgb 6.5.   SUBJECTIVE/OVERNIGHT/INTERVAL HX No events overnight  VITAL SIGNS: BP (!) 113/52   Pulse 82   Temp 100.2 F (37.9 C) (Axillary)   Resp 12   Ht _0  (1.6 m)   Wt 110 lb 3.7 oz (50 kg)   SpO2 100%   BMI 19.53 kg/m   VENTILATOR SETTINGS: Vent Mode: PRVC FiO2 (%):  [40 %] 40 % Set Rate:  [16 bmp] 16 bmp Vt Set:  [340 mL] 340 mL PEEP:  [5 cmH20] 5 cmH20 Pressure Support:  [10 cmH20] 10 cmH20 Plateau Pressure:  [12 cmH20-18 cmH20] 12 cmH20  INTAKE / OUTPUT: I/O last 3 completed shifts: In: 3024.5 [I.V.:1139.5; Blood:160; NG/GT:1575; IV Piggyback:150] Out: 2140 [Urine:2140]  PHYSICAL EXAMINATION: General: sedated but opens eyes HEENT: Intubated Cardiac: RRR, no rubs, murmurs or gallops Pulm: rhonchi anteriorly R > L Abd: soft, nondistended Ext: no pedal edema Neuro: sedated, opens eyes, moves LUE spontaneously   PULMONARY Recent Labs  Lab 01/26/18 0322 01/29/18 2049  PHART 7.306* 7.321*  PCO2ART 45.3 46.4  PO2ART 155* 140.0*  HCO3 21.9 23.8  TCO2  --  25  O2SAT  98.8 99.0    CBC Recent Labs  Lab 01/29/18 2038 01/30/18 0630 01/31/18 0441  HGB 6.5* 8.7* 6.9*  HCT 20.8* 26.9* 21.5*  WBC 12.1* 12.4* 7.8  PLT 27* 8* 61*    COAGULATION No results for input(s): INR in the last 168  hours.  CARDIAC  No results for input(s): TROPONINI in the last 168 hours. No results for input(s): PROBNP in the last 168 hours.   CHEMISTRY Recent Labs  Lab 01/26/18 0419  01/27/18 0316 01/27/18 1124 01/27/18 1614 01/28/18 0325 01/28/18 0500 01/28/18 1613 01/29/18 1035 01/30/18 0630  NA 144  --  140  --   --   --  138  --  140 143  K 3.5  --  3.5  --   --   --  3.6  --  3.9 4.3  CL 112*  --  112*  --   --   --  112*  --  110 111  CO2 21*  --  22  --   --   --  22  --  22 23  GLUCOSE 90  --  105*  --   --   --  169*  --  132* 132*  BUN 34*  --  30*  --   --   --  34*  --  39* 40*  CREATININE 2.18*  --  1.75*  --   --   --  1.46*  --  1.20 1.04  CALCIUM 8.0*  --  7.6*  --   --   --  7.6*  --  7.3* 7.3*  MG  --    < > 2.2 2.1 2.0 2.0  --  2.0  --  2.0  PHOS  --    < > 2.6 2.2* 2.5 1.8* 1.8* 2.7 2.3* 2.6   < > = values in this interval not displayed.   Estimated Creatinine Clearance: 49.4 mL/min (by C-G formula based on SCr of 1.04 mg/dL).   LIVER Recent Labs  Lab 01/27/18 0316 01/28/18 0500 01/29/18 1035 01/30/18 0630  ALBUMIN 1.6* 1.5* 1.6* 1.6*     INFECTIOUS Recent Labs  Lab 01/26/18 0419  PROCALCITON 1.62     ENDOCRINE CBG (last 3)  Recent Labs    01/31/18 1949 02/01/18 0002 02/01/18 0358  GLUCAP 115* 114* 127*      IMAGING x48h  - image(s) personally visualized  -   highlighted in bold Dg Chest Port 1 View  Result Date: 01/30/2018 CLINICAL DATA:  Nasogastric and endotracheal tube placement. EXAM: PORTABLE CHEST 1 VIEW COMPARISON:  Chest radiograph performed earlier today at 4:58 a.m. FINDINGS: The patient's endotracheal tube is seen ending 1-2 cm above the carina. This could be retracted 2 cm. The patient's enteric tube is noted extending below the diaphragm. The stomach is distended with air. A left IJ line is noted ending about the distal SVC. Worsening bilateral airspace opacification is concerning for worsening pneumonia. No significant  pleural effusion or pneumothorax is seen. The cardiomediastinal silhouette is mildly enlarged. No acute osseous abnormalities are identified. IMPRESSION: 1. Endotracheal tube seen ending 1-2 cm above the carina. This could be retracted 2 cm. 2. Enteric tube noted extending below the diaphragm. Stomach is distended with air. 3. Worsening bilateral pneumonia noted. 4. Mild cardiomegaly. Electronically Signed   By: Garald Balding M.D.   On: 01/30/2018 22:14     ASSESSMENT / PLAN:  PULMONARY A: Acute hypoxic respiratory failure - CXR with diffuse airspace disease,  concern for blast crisis vs DAH vs TRALI  Bilateral pulmonary infiltrates - worsening Bronch 4/9 suggestive of pulmonary alveolar hemorrhage P: Vent support - will be one way extubation 4/17 pm and transition to comfort care  CARDIOVASCULAR A: HFpEF -(01/23/18) EF 55%, G2DD, mild MR P: Monitor  RENAL A: AKI-likely prerenal from poor po intake +/- lasix diuresis vs contrast induced P: Limit blood draws  GASTROINTESTINAL A: No active GI bleed Nutrition Gastric distension on CXR P: Tube feeds  HEMATOLOGIC A: 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 Pain/Agitation/Delirium P: -Holding antiplatelets and embolic workup in the setting of severe thrombocytopenia RASS goal:0, -1 -Precedex gtt - Morphine gtt - Fentanyl prn - Transition to full comfort care  4/17 PM with family   FAMILY - Updates: Family updated 4/16 - Code status remains DNR. Plan for withdrawal of care Wednesday, 02/01/18, 4 pm.   Zada Finders, MD Internal Medicine PGY-3  02/01/2018 8:55 AM

## 2018-02-01 NOTE — Progress Notes (Signed)
Received patient in bed non responsive extubated, on precedex and morphine gtts.  Family at bedside, all questions answered at this time.  Patient appears comfortable.  HR=85.

## 2018-02-01 NOTE — Progress Notes (Signed)
   02/01/18 1700  Clinical Encounter Type  Visited With Patient and family together  Visit Type Spiritual support  Referral From Nurse  Consult/Referral To Chaplain  Spiritual Encounters  Spiritual Needs Prayer;Emotional  Responded to consult for End-of-life. Patient was surrounded by family at bedside. Chaplain provided emotional and spiritual support. Prayed with family and informed them that Spiritual care is provided 24/7. Matthew Folks

## 2018-02-01 NOTE — Procedures (Signed)
Extubation Procedure Note  Patient Details:   Name: Tommy Bentley DOB: 1951-01-02 MRN: [id number]   Pt extubated at this time per MD order, family at bedside.  Ciro Backer 02/01/2018, 4:15 PM

## 2018-02-02 LAB — TYPE AND SCREEN
ABO/RH(D): A POS
ANTIBODY SCREEN: NEGATIVE
UNIT DIVISION: 0
Unit division: 0
Unit division: 0
Unit division: 0

## 2018-02-02 LAB — BPAM RBC
BLOOD PRODUCT EXPIRATION DATE: 201905092359
Blood Product Expiration Date: 201905092359
Blood Product Expiration Date: 201905092359
Blood Product Expiration Date: 201905092359
ISSUE DATE / TIME: 201904150043
ISSUE DATE / TIME: 201904150050
ISSUE DATE / TIME: 201904160629
Unit Type and Rh: 6200
Unit Type and Rh: 6200
Unit Type and Rh: 6200
Unit Type and Rh: 6200

## 2018-02-02 MED ORDER — LORAZEPAM 2 MG/ML IJ SOLN
2.0000 mg | INTRAMUSCULAR | Status: DC | PRN
  Administered 2018-02-02: 2 mg via INTRAVENOUS
  Filled 2018-02-02: qty 1

## 2018-02-02 MED ORDER — HYDROMORPHONE HCL 2 MG/ML IJ SOLN: 2.0000 mg | INTRAMUSCULAR | Status: DC | PRN

## 2018-02-02 MED ORDER — HYDROMORPHONE HCL 1 MG/ML IJ SOLN: 2.0000 mg | INTRAMUSCULAR | Status: DC | PRN

## 2018-02-02 MED ORDER — LORAZEPAM 2 MG/ML IJ SOLN
2.0000 mg | INTRAMUSCULAR | Status: DC | PRN
  Administered 2018-02-02: 4 mg via INTRAVENOUS
  Filled 2018-02-02: qty 2

## 2018-02-02 MED ORDER — MORPHINE BOLUS VIA INFUSION
4.0000 mg | INTRAVENOUS | Status: DC | PRN
  Administered 2018-02-02 (×2): 6 mg via INTRAVENOUS
  Filled 2018-02-02: qty 6

## 2018-02-03 LAB — CULTURE, BLOOD (ROUTINE X 2)
Culture: NO GROWTH
SPECIAL REQUESTS: ADEQUATE

## 2018-02-09 ENCOUNTER — Telehealth: Payer: Self-pay

## 2018-02-09 NOTE — Telephone Encounter (Signed)
On 02/09/18 I received a d/c from [facility] (original). The d/c is for burial The patient is a patient of Doctor Byrum.   The d/c will be taken to Pulmonary Unit for signature.  On 02/14/2018 I received the d/c back from Doctor Byrum. I got the d/c ready and called the funeral home to let them know I mailed the d/c to vital records per the funeral home request.

## 2018-02-15 NOTE — Progress Notes (Signed)
Pt. deceased and the left over Morphine 75 ML is wasted with a witness: Carla,RN

## 2018-02-15 NOTE — Progress Notes (Signed)
Nutrition Brief Note  Chart reviewed. Pt now transitioning to comfort care.  No further nutrition interventions warranted at this time.  Please re-consult as needed.    Kimberly Harris, RD, LDN, CNSC Pager [phone] After Hours Pager [phone]    

## 2018-02-15 NOTE — Death Summary Note (Signed)
  Name: Tommy Bentley MRN: [id number] DOB: 04-11-1951 67 y.o.  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    Hospital Course: 67 year old [place] gentleman who was admitted on 4/7 with altered mental status found to be profoundly anemic, thrombocytopenic and to have watershed strokes in the bilateral MCA distribution, right PCA distribution. He received 4 units PRBCs on admission. He subsequently required intubation and ventilation for airway protection on 4/9. Bronchoscopy was consistent with alveolar hemorrhage. Subsequent evaluation revealed that a new diagnosis of AML was responsible for these abnormalities. He was treated empirically for presumed septic shock with antibiotics, pressors. Hematology/Oncology were consulted and suggested palliative care versus aggressive inpatient treatment of AML at [facility]  elected to pursue comfort care and patient was extubated 4/17 and transitioned full comfort care. He was pronounced deceased on February 14, 2018 at 19:10.   Signed: Zada Finders, MD  Internal Medicine PGY-3 02/03/2018, 8:09 AM

## 2018-02-15 NOTE — Progress Notes (Signed)
At 1910, called to patient room and noted patient unresponsive, not breathing , no HR. Death verified by  another RN T.G. Md made aware.

## 2018-02-15 NOTE — Care Management Note (Signed)
Case Management Note  Patient Details  Name: Tommy Bentley MRN: [id number] Date of Birth: 1951/04/24  Subjective/Objective:   Pt admitted with Stroke                  Action/Plan:  PTA from home.  Pt remains on vent.  CM will continue to follow for discharge needs   Expected Discharge Date:  01/26/18               Expected Discharge Plan:     In-House Referral:     Discharge planning Services  CM Consult  Post Acute Care Choice:    Choice offered to:     DME Arranged:    DME Agency:     HH Arranged:    HH Agency:     Status of Service:     If discussed at [employer], dates discussed:    Additional Comments: 02-07-2018 Pt has transitioned to full comfort care Maryclare Labrador, RN 02/07/2018, 3:02 PM

## 2018-02-15 NOTE — Progress Notes (Signed)
PULMONARY / CRITICAL CARE MEDICINE   Name: Tommy Bentley MRN: [id number] DOB: 1950/12/18    ADMISSION DATE:  01/24/2018  REFERRING MD:  Dr. Shan Levans ([facility])  CHIEF COMPLAINT:  Aphasia, bloody stool, and right sided weakness  Brief patient summary: 67 y.o [place] male with unknown med hx presented on 4/7 with aphasia, right weakness, profound anemia, and thrombocytopenia. Imaging sig for bilateral mca infarcts and right pca infarct. MRA with right carotid siphon stenosis. On 4/9 he required intubation and ventilation for airway protection. Bronch consistent with alveolar hemorrhage. Flow cytometry results consistent with AML and pathology review showed Leukoerythroblastic smear with shistocytes and NRBC's. Hematology/Oncology were consulted and suggested palliative care versus aggressive inpatient treatment of AML at [place] elected for comfort care.  STUDIES:  MRI(4/7):  1. Numerous small foci of acute/early subacute infarction are present throughout the left hemisphere, largely in a watershed distribution. 2. Late subacute to chronic hemorrhagic infarct in right occipital lobe. 3. No acute hemorrhage, mass effect, or herniation. 4. Background of moderate chronic microvascular ischemic changes and parenchymal volume loss of the brain.  CT head (4/7): Areas of low-density in the right occipital lobe and within the left hemispheric white matter worrisome for acute/subacute infarctions. No hemorrhage or mass effect.  MRA (4/7): 1. Left M2 superior division appears to have common origin with left A1 and is occluded proximally. 2. No additional large vessel occlusion of the intracranial circulation identified.  MRA neck: Severe motion artifact. Patent carotid systems and vertebral arteries. No definite evidence for hemodynamically significant stenosis by NASCET criteria.  CT abdomen/pelvis 4/7:negative for acute intra-abdominal processes   CXR  4/9:diffuse, bilateral airspace opacities, no focal consolidations or pleural effusions   CULTURES: RVP 4/9 - neg BAL 4/9 >> no growth BCx 4/14 >> coag neg staph aureus Trach aspirate 4/14 >> enterobacter cloacae  ANTIBIOTICS: Azithro 4/7 Ceftriaxone 4/7 Cefepime 4/8 >> 4/9 Vancomycin 4/14 >> 4/15 Cefepime 4/14 >> 4/16  LINES/TUBES: ETT 4/9 >> 4/17 CVC LIJ4/9  SIGNIFICANT EVENTS: 4/7 > admitted to [facility] with Lt sided CVA, Hgb 3.9 - transfused 4 units PRBCs 4/9 > admitted to ICU + bronchoscopy + intubation 4/11 > Code status changed to DNR 4/12 - patient is sedated and intubated. Family decided to change code status to DNR on 4/11. 4/13 - Ongoing alveolar hemorrhage via subglottic suction. Making urine. Not on pressors 4/14 - febrile, worsening infiltrates on CXR and leukocytosis - repeated BCx and started Vanc/Cefepime. Transfused 1 unit PRBC for Hgb 6.5. 4/15 - Plts 8k, transfused 1 unit platelets 4/17 - One-way extubation - made full comfort care   SUBJECTIVE/OVERNIGHT/INTERVAL HX Extubated 4/17 - made full comfort care. Family at bedside  VITAL SIGNS: BP (!) 106/50 (BP Location: Left Arm)   Pulse 73   Temp 97.7 F (36.5 C) (Axillary)   Resp 14   Ht _0  (1.6 m)   Wt 110 lb 3.7 oz (50 kg)   SpO2 (!) 85%   BMI 19.53 kg/m   VENTILATOR SETTINGS:   INTAKE / OUTPUT: I/O last 3 completed shifts: In: 1615.1 [I.V.:670.1; NG/GT:945] Out: 1000 [Urine:1000]  PHYSICAL EXAMINATION: General: sedated Cardiac: RRR, no rubs, murmurs or gallops Pulm: rhonchi anteriorly Abd: nondistended Ext: no pedal edema Neuro: sedated, not responsive   PULMONARY Recent Labs  Lab 01/29/18 2049  PHART 7.321*  PCO2ART 46.4  PO2ART 140.0*  HCO3 23.8  TCO2 25  O2SAT 99.0    CBC Recent Labs  Lab 01/29/18 2038 01/30/18 0630 01/31/18 0441  HGB 6.5* 8.7* 6.9*  HCT 20.8* 26.9* 21.5*  WBC 12.1* 12.4* 7.8  PLT 27* 8* 61*    COAGULATION No results for input(s): INR  in the last 168 hours.  CARDIAC  No results for input(s): TROPONINI in the last 168 hours. No results for input(s): PROBNP in the last 168 hours.   CHEMISTRY Recent Labs  Lab 01/27/18 0316 01/27/18 1124 01/27/18 1614 01/28/18 0325 01/28/18 0500 01/28/18 1613 01/29/18 1035 01/30/18 0630  NA 140  --   --   --  138  --  140 143  K 3.5  --   --   --  3.6  --  3.9 4.3  CL 112*  --   --   --  112*  --  110 111  CO2 22  --   --   --  22  --  22 23  GLUCOSE 105*  --   --   --  169*  --  132* 132*  BUN 30*  --   --   --  34*  --  39* 40*  CREATININE 1.75*  --   --   --  1.46*  --  1.20 1.04  CALCIUM 7.6*  --   --   --  7.6*  --  7.3* 7.3*  MG 2.2 2.1 2.0 2.0  --  2.0  --  2.0  PHOS 2.6 2.2* 2.5 1.8* 1.8* 2.7 2.3* 2.6   Estimated Creatinine Clearance: 49.4 mL/min (by C-G formula based on SCr of 1.04 mg/dL).   LIVER Recent Labs  Lab 01/27/18 0316 01/28/18 0500 01/29/18 1035 01/30/18 0630  ALBUMIN 1.6* 1.5* 1.6* 1.6*     INFECTIOUS No results for input(s): LATICACIDVEN, PROCALCITON in the last 168 hours.   ENDOCRINE CBG (last 3)  Recent Labs    02/01/18 0358 02/01/18 0803 02/01/18 1149  GLUCAP 127* 116* 105*      IMAGING x48h  - image(s) personally visualized  -   highlighted in bold No results found.   DISCUSSION: 67 year old [place] gentleman who was admitted on 4/7 with altered mental status found to be profoundly anemic, thrombocytopenic and to have watershed strokes in the bilateral MCA distribution, right PCA distribution. He required intubation and ventilation for airway protection on 4/9. Bronchoscopy was consistent with alveolar hemorrhage. Subsequent evaluation has revealed that a new diagnosis of AML is responsible for these abnormalities.  He has been treated empirically for presumed septic shock with antibiotics, pressors. Family elected to pursue comfort care and patient was extubated 4/17 and made full comfort care.  ASSESSMENT / PLAN: Acute  Hypoxic Respiratory Failure with profound anemia, diffuse alveolar hemorrhage, bilateral MCA and Rt PCA CVAs with right sided deficits, secondary to newly diagnosed AML. Now full comfort care per family wishes and extubated 4/17. - Continue comfort care - Morphine gtt - titrate to comfort - Ativan prn for anxiety - Robinul/Zofran - No further lab draws/imaging - Will consider transfer to 6N/Palliative Room today   Zada Finders, MD Internal Medicine PGY-3  2018/02/15 8:22 AM

## 2018-02-15 NOTE — Progress Notes (Signed)
Palliative:  I met today at Tommy Bentley bedside. 2 family members at bedside. Tommy Bentley appears more uncomfortable and mod distress. RN gave morphine and ativan bolus which provided relief. Increased prn medication dosage and availability to ensure comfort. Explained signs of progression at EOL to family at bedside. Discussed plan for comfort medications with RN.   Late entry: I came back later and Tommy Bentley tachycardic but appears otherwise fairly comfortable. Requested RN to provide another dose of Ativan to help with slight tachypnea. Family sleeping at bedside and I did not awaken.   25 min  Vinie Sill, NP Palliative Medicine Team Pager # [phone] (M-F 8a-5p) Team Phone # [phone] (Nights/Weekends)

## 2018-02-15 DEATH — deceased

## 2018-08-20 IMAGING — DX DG CHEST 1V PORT
1 series · 1 of 1 positions shown · non-contrast
Comparison: 01/24/2018

CLINICAL DATA: Shortness of Breath

EXAM:
PORTABLE CHEST 1 VIEW

[chest]
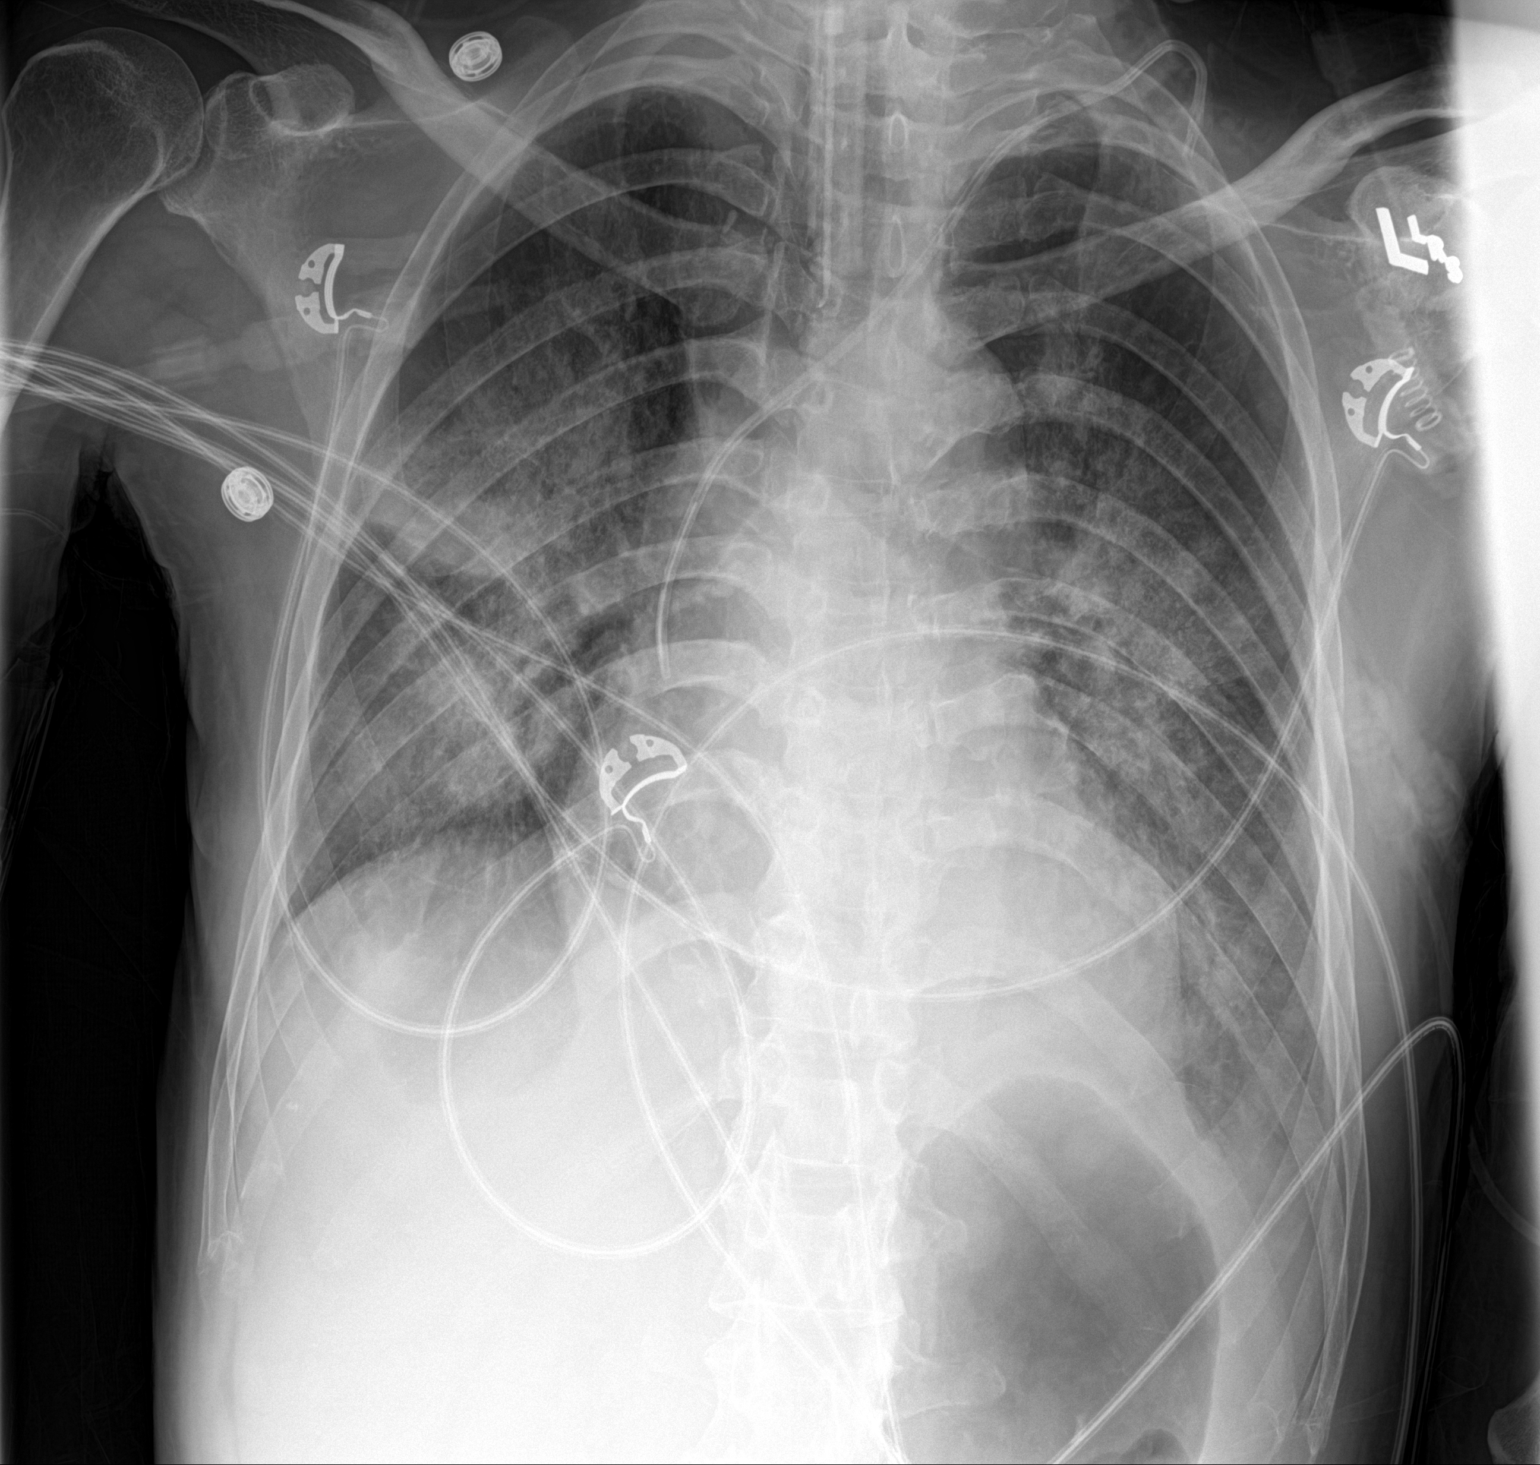

[1 of 1 positions shown; findings below may reference images not displayed]

FINDINGS: Cardiac shadow is stable. Left jugular central line is again seen
and stable. Endotracheal tube is noted in satisfactory position.
Prominent bilateral lung opacities are identified as well as fluid
in the minor fissure on the right. The overall appearance is stable
from the prior exam. No new focal abnormality is noted.
IMPRESSION: Stable bilateral lung opacities with pleural fluid within the right
minor fissure.

## 2018-08-22 IMAGING — DX DG CHEST 1V PORT
1 series · 1 of 1 positions shown · non-contrast
Comparison: 01/26/2018

CLINICAL DATA: Evaluate endotracheal tube

EXAM:
PORTABLE CHEST 1 VIEW

[chest ap]
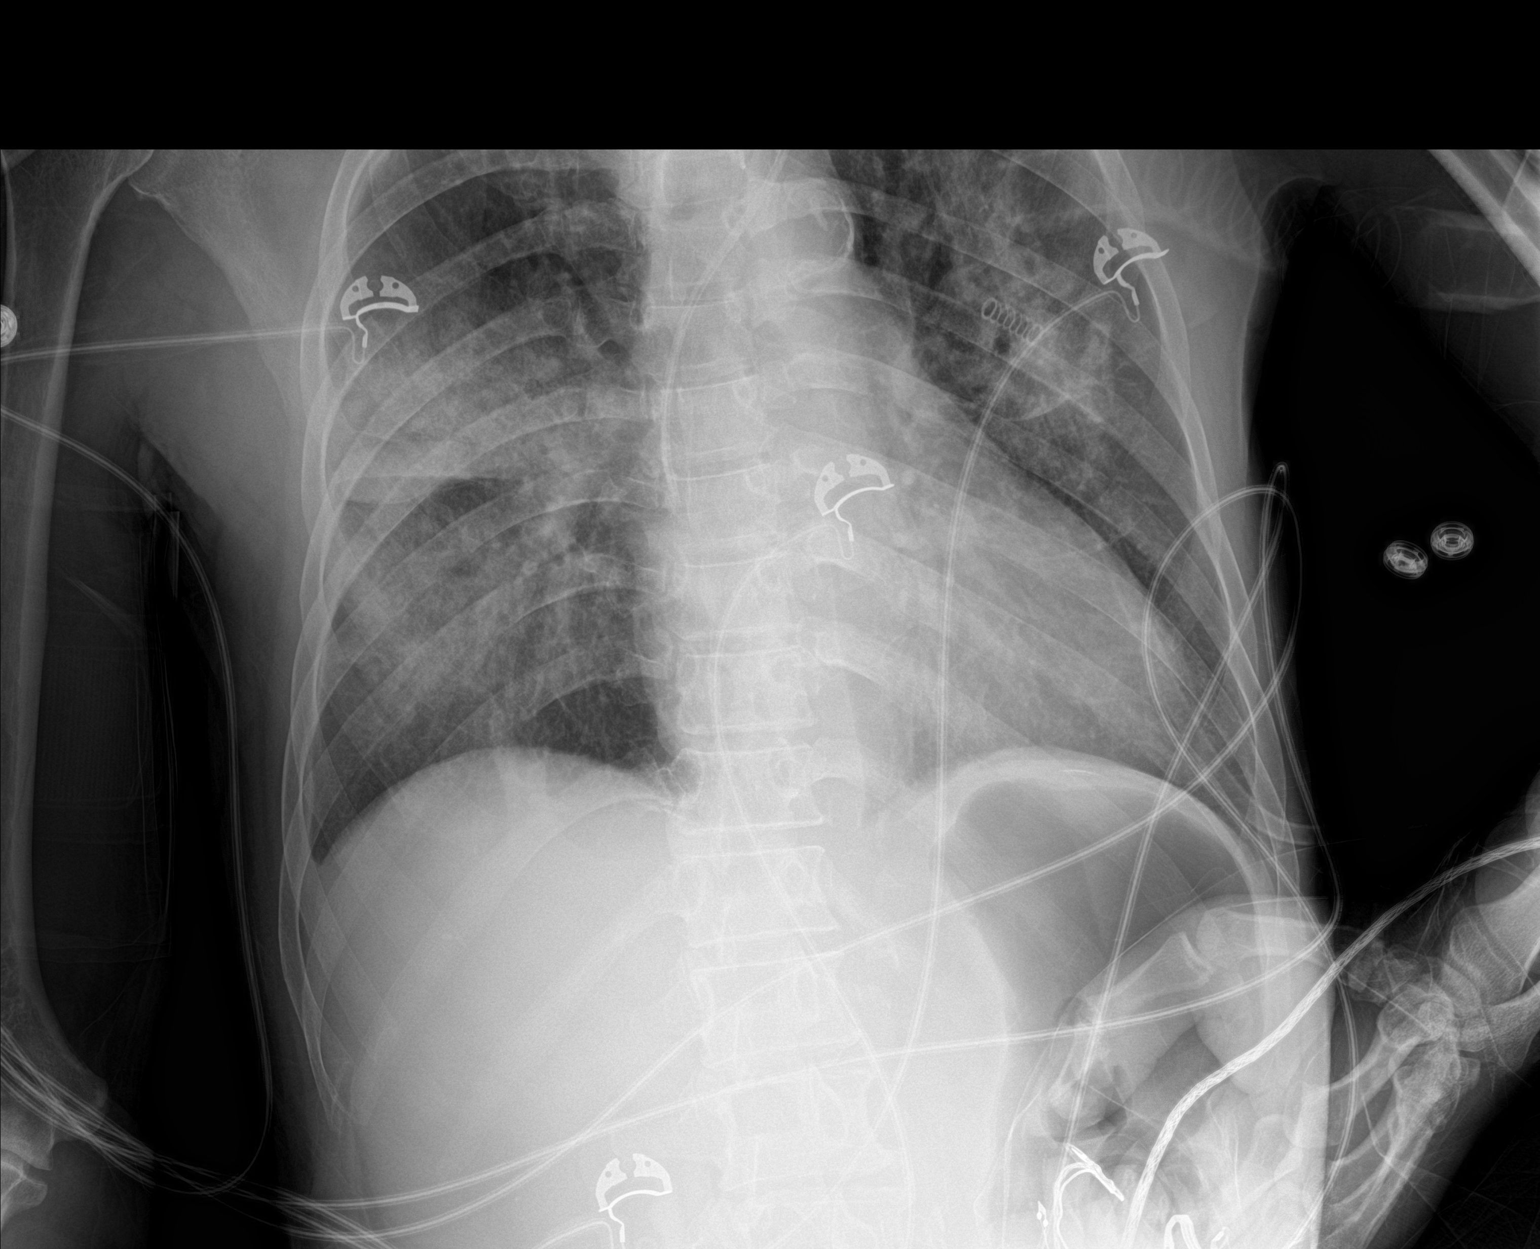

[1 of 1 positions shown; findings below may reference images not displayed]

FINDINGS: Endotracheal tube and left jugular central line are again noted and
stable. Patchy infiltrates are again identified bilaterally right
greater than left. Cardiac shadow is stable. No pneumothorax is
noted. No bony abnormality is seen.
IMPRESSION: Stable bilateral infiltrates right greater than left.

## 2018-08-23 IMAGING — DX DG CHEST 1V PORT
1 series · 1 of 1 positions shown · non-contrast
Comparison: Chest x-rays dated 01/27/2018 and 01/26/2018.

CLINICAL DATA: Spicer base shin

EXAM:
PORTABLE CHEST 1 VIEW

[chest ap]
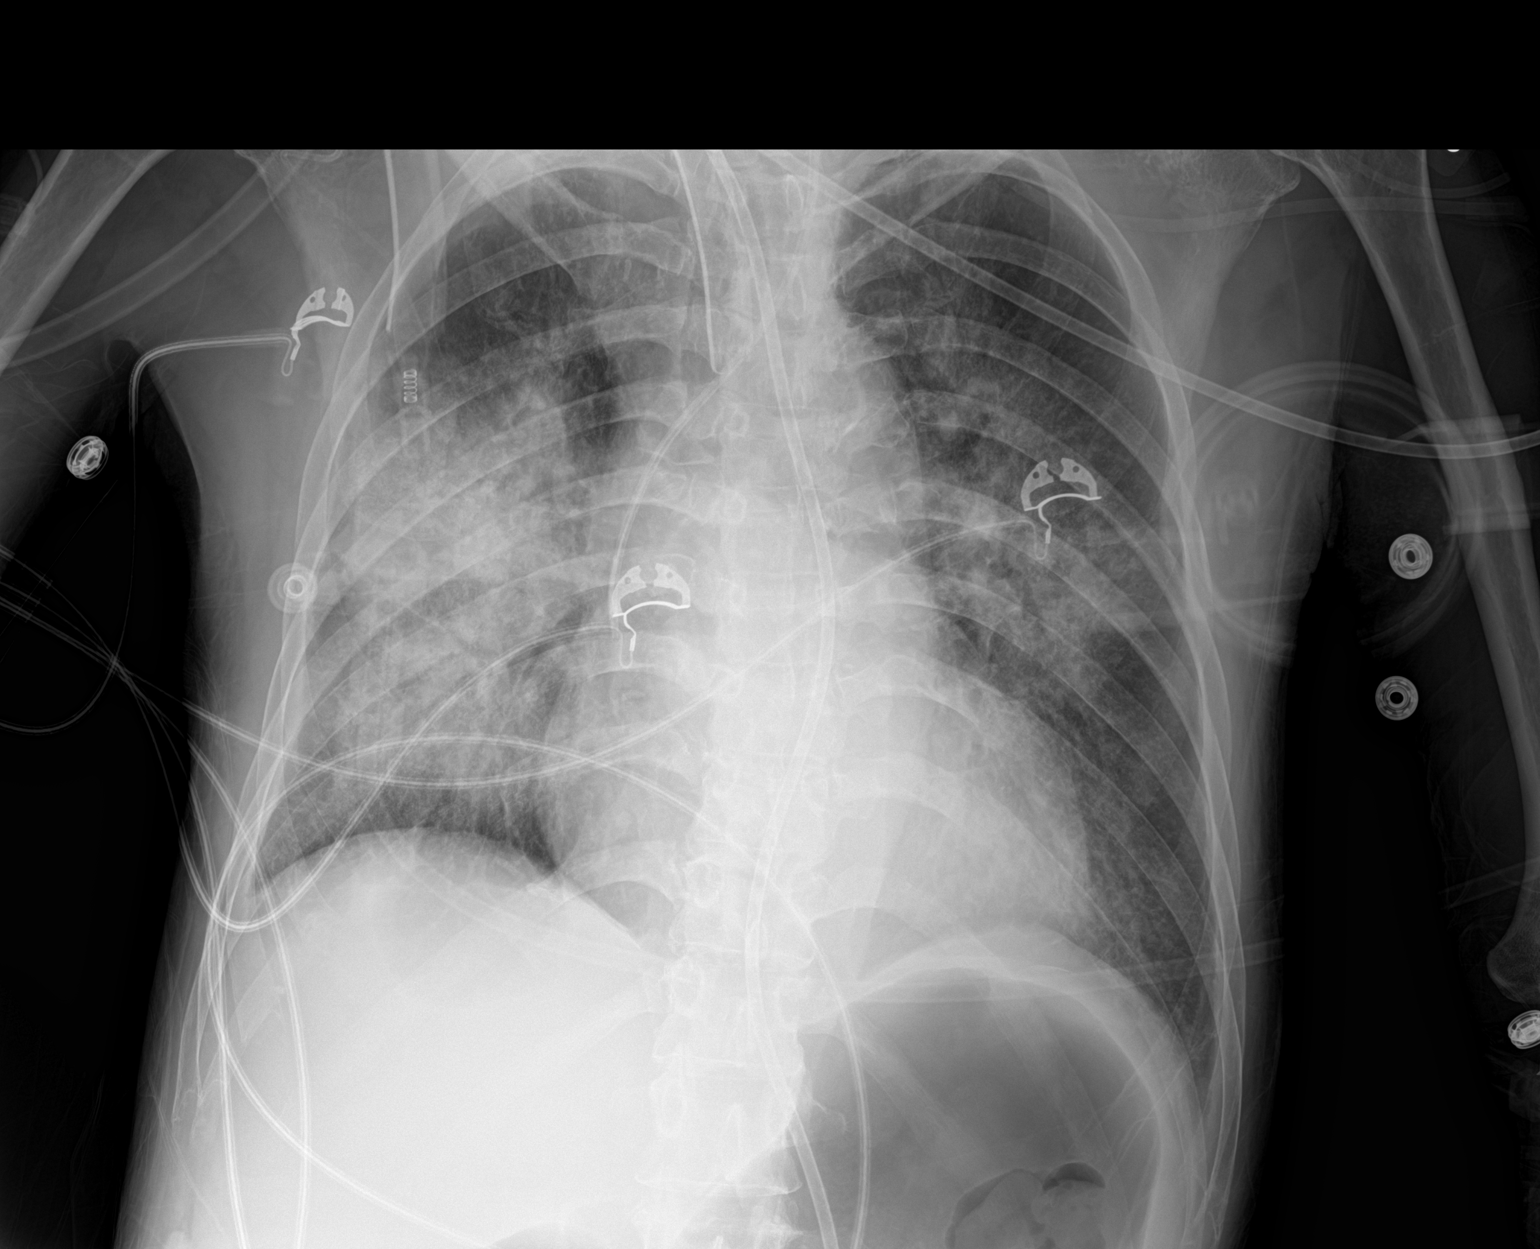

[1 of 1 positions shown; findings below may reference images not displayed]

FINDINGS: Perihilar opacities are stable in extent, although now denser on the
RIGHT, a get and RIGHT greater than LEFT. No pleural effusion or
pneumothorax seen. Stable cardiomegaly.

Endotracheal tube is well positioned with tip just above the level
of the carina. Enteric tube passes below the diaphragm. LEFT IJ
central line is stable in position with tip at the level of the
mid/lower SVC.
IMPRESSION: 1. Persistent perihilar opacities, stable in overall extent although
denser on the RIGHT today, again RIGHT greater than LEFT.
Differential includes multifocal pneumonia, pulmonary edema and
aspiration.
2. Support apparatus appears appropriately positioned.

## 2018-08-25 IMAGING — DX DG CHEST 1V PORT
1 series · 1 of 1 positions shown · non-contrast
Comparison: 01/28/2018

CLINICAL DATA: Endotracheally intubated.

EXAM:
PORTABLE CHEST 1 VIEW

[chest ap]
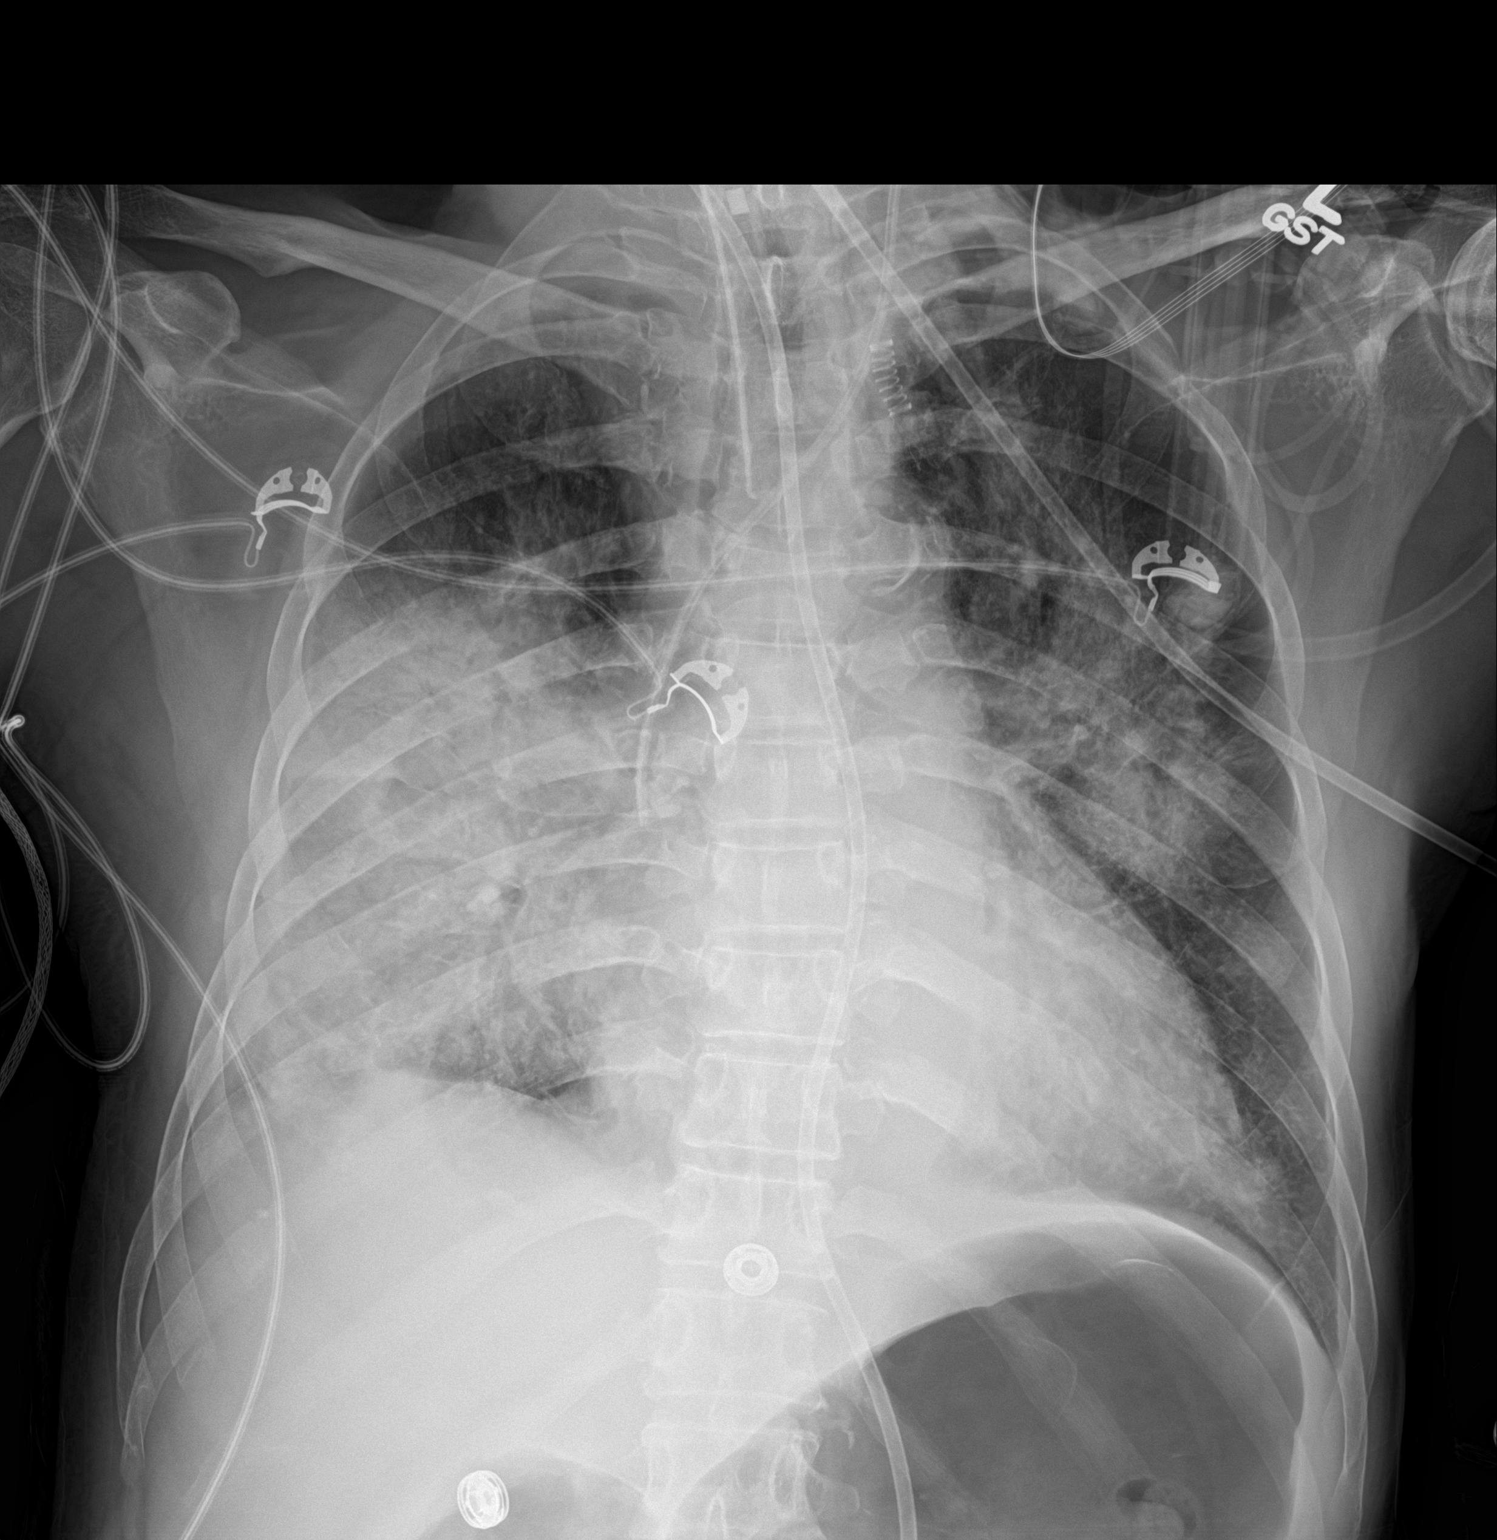

[1 of 1 positions shown; findings below may reference images not displayed]

FINDINGS: The heart size is normal. Endotracheal tube is stable. A left IJ
line is stable. A small bore feeding tube courses off the inferior
border of the film. Significant distention of the stomach is noted.

The heart size is normal. Bilateral airspace disease is increasing.
There is involvement of the right upper lobe and left lower lobe.
Perihilar disease on the left has increased.
IMPRESSION: 1. Progressive bilateral airspace disease, right greater than left.
2. Support apparatus is stable.
3. Progressive gastric distention.

## 2018-08-25 IMAGING — DX DG CHEST 1V PORT
1 series · 1 of 1 positions shown · non-contrast
Comparison: Chest radiograph performed earlier today at [DATE] a.m.

CLINICAL DATA: Nasogastric and endotracheal tube placement.

EXAM:
PORTABLE CHEST 1 VIEW

[chest ap]
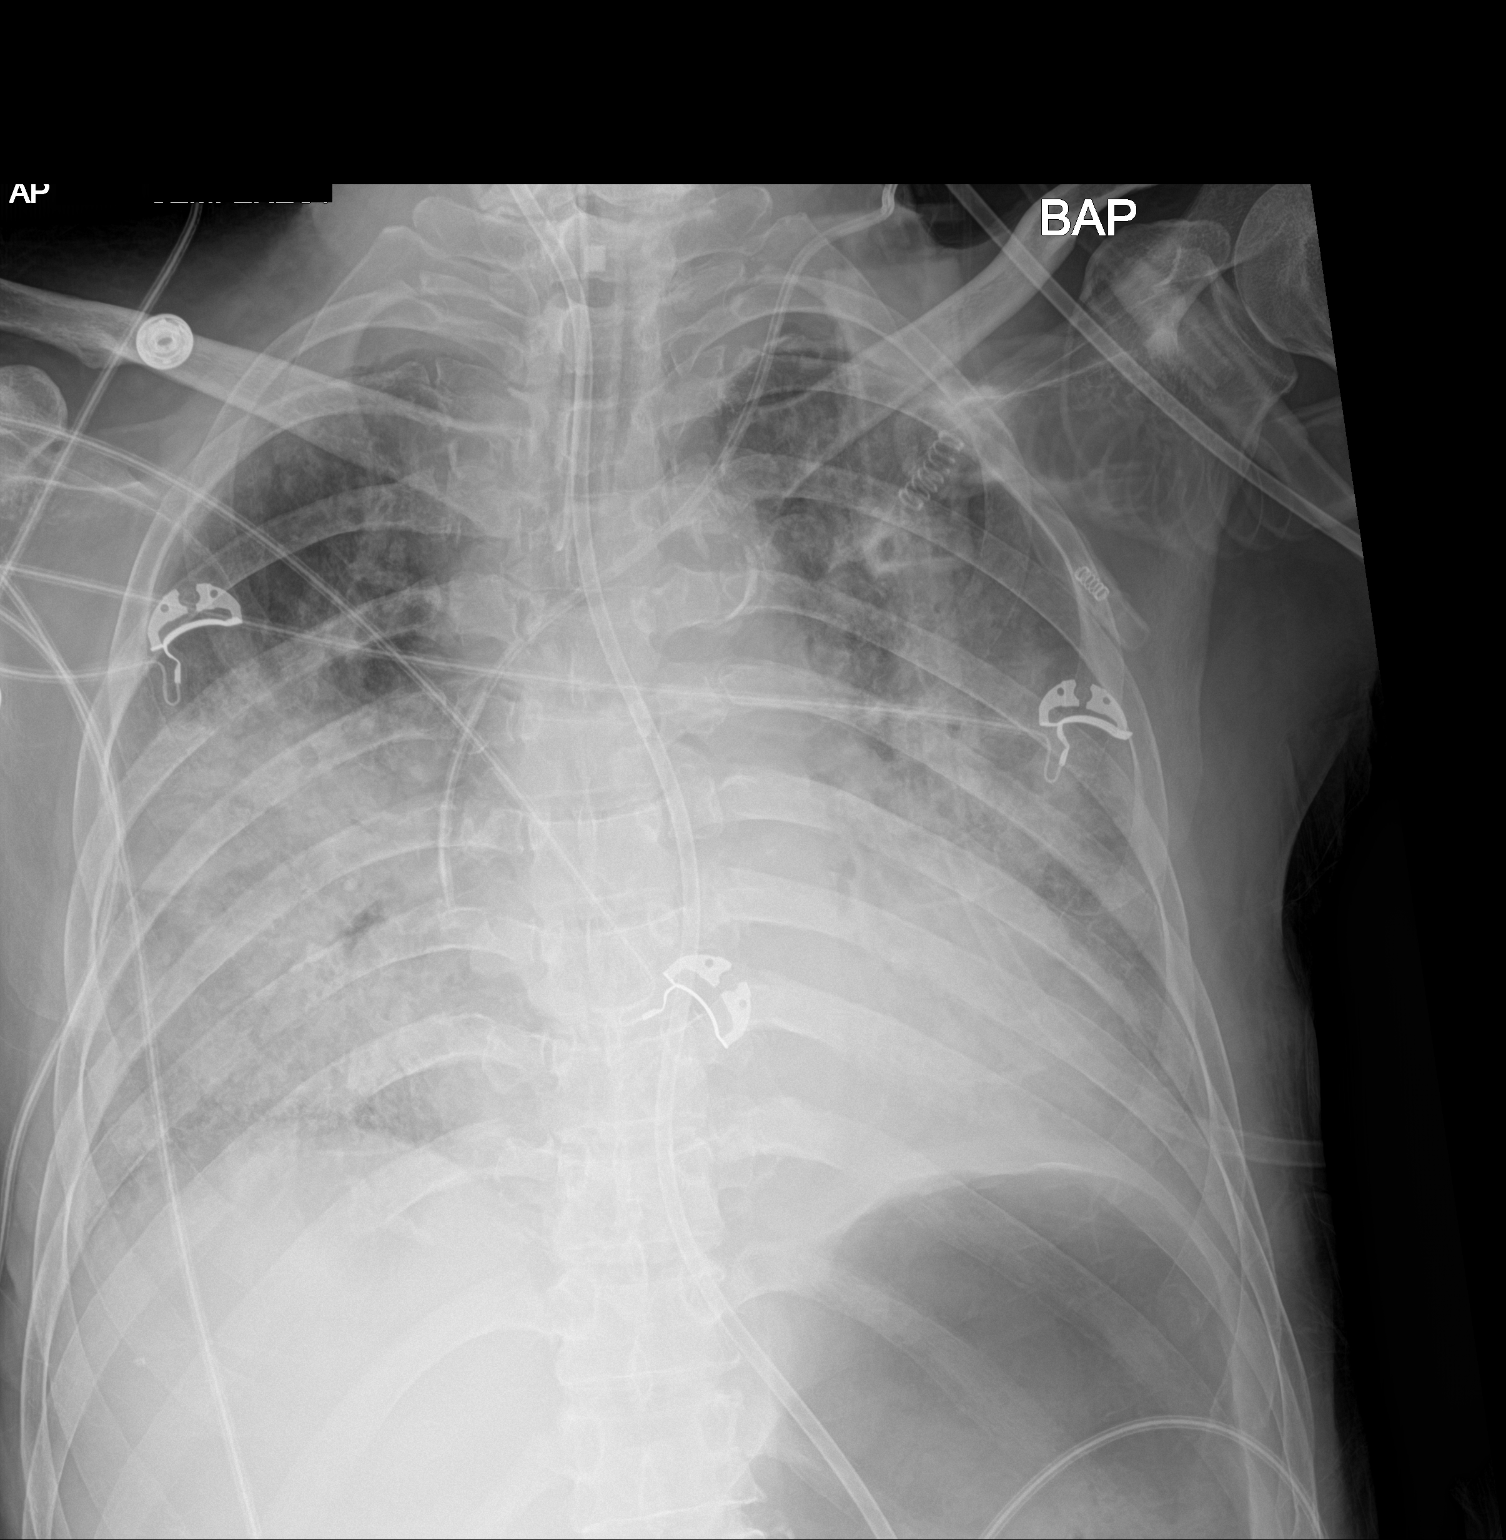

[1 of 1 positions shown; findings below may reference images not displayed]

FINDINGS: The patient's endotracheal tube is seen ending 1-2 cm above the
carina. This could be retracted 2 cm.

The patient's enteric tube is noted extending below the diaphragm.
The stomach is distended with air. A left IJ line is noted ending
about the distal SVC.

Worsening bilateral airspace opacification is concerning for
worsening pneumonia. No significant pleural effusion or pneumothorax
is seen.

The cardiomediastinal silhouette is mildly enlarged. No acute
osseous abnormalities are identified.
IMPRESSION: 1. Endotracheal tube seen ending 1-2 cm above the carina. This could
be retracted 2 cm.
2. Enteric tube noted extending below the diaphragm. Stomach is
distended with air.
3. Worsening bilateral pneumonia noted.
4. Mild cardiomegaly.
